# Patient Record
Sex: Female | Born: 1945
Health system: Southern US, Community
[De-identification: ages and names within clinical notes are randomized; demographics above are authoritative.]

## PROBLEM LIST (undated history)

## (undated) DIAGNOSIS — E782 Mixed hyperlipidemia: Secondary | ICD-10-CM

## (undated) DIAGNOSIS — K219 Gastro-esophageal reflux disease without esophagitis: Secondary | ICD-10-CM

## (undated) DIAGNOSIS — R251 Tremor, unspecified: Secondary | ICD-10-CM

## (undated) DIAGNOSIS — K635 Polyp of colon: Secondary | ICD-10-CM

## (undated) DIAGNOSIS — F418 Other specified anxiety disorders: Secondary | ICD-10-CM

## (undated) DIAGNOSIS — E559 Vitamin D deficiency, unspecified: Secondary | ICD-10-CM

## (undated) DIAGNOSIS — C439 Malignant melanoma of skin, unspecified: Secondary | ICD-10-CM

## (undated) DIAGNOSIS — I872 Venous insufficiency (chronic) (peripheral): Secondary | ICD-10-CM

## (undated) HISTORY — DX: Other specified anxiety disorders: F41.8

## (undated) HISTORY — DX: Gastro-esophageal reflux disease without esophagitis: K21.9

## (undated) HISTORY — DX: Malignant melanoma of skin, unspecified: C43.9

## (undated) HISTORY — DX: Venous insufficiency (chronic) (peripheral): I87.2

## (undated) HISTORY — PX: CATARACT EXTRACTION: SUR2

## (undated) HISTORY — PX: BUNIONECTOMY: SHX129

## (undated) HISTORY — DX: Tremor, unspecified: R25.1

## (undated) HISTORY — DX: Mixed hyperlipidemia: E78.2

## (undated) HISTORY — DX: Vitamin D deficiency, unspecified: E55.9

## (undated) HISTORY — DX: Polyp of colon: K63.5

---

## 1999-08-02 ENCOUNTER — Other Ambulatory Visit: Admission: RE | Admit: 1999-08-02 | Discharge: 1999-08-02 | Payer: Self-pay | Admitting: Obstetrics & Gynecology

## 2000-12-11 ENCOUNTER — Encounter: Payer: Self-pay | Admitting: Obstetrics & Gynecology

## 2000-12-11 ENCOUNTER — Encounter: Admission: RE | Admit: 2000-12-11 | Discharge: 2000-12-11 | Payer: Self-pay | Admitting: Obstetrics & Gynecology

## 2000-12-11 ENCOUNTER — Other Ambulatory Visit: Admission: RE | Admit: 2000-12-11 | Discharge: 2000-12-11 | Payer: Self-pay | Admitting: Obstetrics & Gynecology

## 2001-06-18 ENCOUNTER — Encounter: Payer: Self-pay | Admitting: Family Medicine

## 2001-06-18 ENCOUNTER — Encounter: Admission: RE | Admit: 2001-06-18 | Discharge: 2001-06-18 | Payer: Self-pay | Admitting: Family Medicine

## 2001-11-05 ENCOUNTER — Ambulatory Visit (HOSPITAL_COMMUNITY): Admission: RE | Admit: 2001-11-05 | Discharge: 2001-11-05 | Payer: Self-pay | Admitting: Gastroenterology

## 2001-11-05 ENCOUNTER — Encounter (INDEPENDENT_AMBULATORY_CARE_PROVIDER_SITE_OTHER): Payer: Self-pay | Admitting: Specialist

## 2002-05-28 ENCOUNTER — Encounter: Admission: RE | Admit: 2002-05-28 | Discharge: 2002-05-28 | Payer: Self-pay | Admitting: Obstetrics & Gynecology

## 2002-05-28 ENCOUNTER — Encounter: Payer: Self-pay | Admitting: Obstetrics & Gynecology

## 2003-06-10 ENCOUNTER — Encounter: Admission: RE | Admit: 2003-06-10 | Discharge: 2003-06-10 | Payer: Self-pay | Admitting: Obstetrics & Gynecology

## 2003-06-10 ENCOUNTER — Encounter: Payer: Self-pay | Admitting: Obstetrics & Gynecology

## 2004-03-12 ENCOUNTER — Emergency Department (HOSPITAL_COMMUNITY): Admission: EM | Admit: 2004-03-12 | Discharge: 2004-03-12 | Payer: Self-pay

## 2004-05-27 ENCOUNTER — Other Ambulatory Visit: Admission: RE | Admit: 2004-05-27 | Discharge: 2004-05-27 | Payer: Self-pay | Admitting: Obstetrics & Gynecology

## 2004-08-06 ENCOUNTER — Ambulatory Visit (HOSPITAL_COMMUNITY): Admission: RE | Admit: 2004-08-06 | Discharge: 2004-08-06 | Payer: Self-pay | Admitting: Obstetrics & Gynecology

## 2005-12-07 ENCOUNTER — Ambulatory Visit (HOSPITAL_COMMUNITY): Admission: RE | Admit: 2005-12-07 | Discharge: 2005-12-07 | Payer: Self-pay | Admitting: Obstetrics & Gynecology

## 2006-08-06 ENCOUNTER — Observation Stay (HOSPITAL_COMMUNITY): Admission: EM | Admit: 2006-08-06 | Discharge: 2006-08-06 | Payer: Self-pay | Admitting: Emergency Medicine

## 2007-01-04 ENCOUNTER — Ambulatory Visit (HOSPITAL_COMMUNITY): Admission: RE | Admit: 2007-01-04 | Discharge: 2007-01-04 | Payer: Self-pay | Admitting: Obstetrics & Gynecology

## 2008-01-31 ENCOUNTER — Ambulatory Visit (HOSPITAL_COMMUNITY): Admission: RE | Admit: 2008-01-31 | Discharge: 2008-01-31 | Payer: Self-pay | Admitting: Obstetrics & Gynecology

## 2010-12-26 DIAGNOSIS — C439 Malignant melanoma of skin, unspecified: Secondary | ICD-10-CM

## 2010-12-26 HISTORY — PX: MELANOMA EXCISION: SHX5266

## 2010-12-26 HISTORY — DX: Malignant melanoma of skin, unspecified: C43.9

## 2011-05-13 NOTE — H&P (Signed)
NAMEMILAROSE, SAVICH                 ACCOUNT NO.:  [id number]   MEDICAL RECORD NO.:  [id number]          PATIENT TYPE:  EMS   LOCATION:  MAJO                         FACILITY:  [facility]   PHYSICIAN:  Deirdre Peer. Polite, M.D. DATE OF BIRTH:  05/02/1946   DATE OF ADMISSION:  08/05/2006  DATE OF DISCHARGE:                                HISTORY & PHYSICAL   CHIEF COMPLAINT:  Per records, near syncope.   HISTORY OF PRESENT ILLNESS:  A 65 year old female, with no history of  dyslipidemia, recently given a diagnosis of GERD, depression, however,  noncompliant with meds greater than a year, presents to the ED via EMS after  being evaluated by [facility] physician at the walk-in clinic.  According to the  patient, has not been feeling well for at least two to three weeks,  chronically feeling as if she was going to pass out; however, she denies any  palpitations or arrhythmias.  Does admit to some dull sensation of pressure  in her chest.  She also denies any nausea, denies any vomiting, denies any  vertiginous symptoms.  The patient was seen at the walk-in clinic today.  While there, the patient was being evaluated initially, found to be  hypertensive at 170/120.  Upon getting up the patient had a drop in her BP  down to 50 systolic with complaints of dyspnea and was noted to be  tachycardic as well as diaphoretic.  The patient was put in Trendelenburg,  given IV fluids and felt better.  Patient transferred to the ED for further  evaluation.  EKG is notable for sinus tachycardia in the office and in [facility]  ED the patient's EKG is without any acute abnormalities.  The patient had  point of care enzymes which were within normal limits.  Had a D-dimer that  was elevated at 1.86.  BMET within normal limits.  CBC with a mild  leukocytosis of 11.3.  Otherwise within normal limits.  The patient had a CT  of the chest per ER attending reported as negative.  CT currently appears to  be locked, therefore  definitive report cannot be reviewed at this time.  Because of the above symptoms, [facility] was called for evaluation.  At  the time of my eval the patient was alert and oriented x3 without any  dizziness, weakness, etc.  The patient still is hypertensive, BP of 171/79.  The patient did have orthostatics which were positive, however, BP was still  on the high end.  Initial BP lying was 189/89, standing 155/87, sitting  138/115 with the pulse ranging from 90 to 101.  The patient describes events  as stated above.  Initially, the patient thought that she would be sent  home, however, upon pressing upon her the seriousness of her event today the  patient reluctantly stayed.  Again, the patient denies any fever, chills,  nausea, vomiting, diarrhea, constipation.  Does admit to some vague pressure  sensation in her sternum area.  Denies any orthopnea or PND.  Just kind of  general sensation throughout the week as if she periodically  felt like she  was going to pass out.  The patient had vertigo symptoms in the past and  states that these symptoms that she experienced in the last couple of weeks  are not similar.   PAST MEDICAL HISTORY:  As stated above significant for depression, high  cholesterol and GERD.   MEDICATIONS ON ADMISSION:  Aspirin 81 mg, Lipitor 20 mg, Protonix 40 mg.   SOCIAL HISTORY:  Negative for tobacco, alcohol or drugs.   PAST SURGICAL HISTORY:  None.   ALLERGIES:  She reports an allergy to penicillin which caused a rash.   FAMILY HISTORY:  Mother with multiple myeloma.  Father with myelodysplastic  syndrome.  A brother with MS.  A sister who is okay.   REVIEW OF SYSTEMS:  As stated in the HPI.   PHYSICAL EXAMINATION:  GENERAL:  The patient is alert and oriented x3 in no  apparent distress.  VITAL SIGNS:  Temp 97.6, BP 171/79, pulse 92, respiratory rate 16.  HEENT:  Pupils are equal, round and reactive to light.  Anicteric sclerae.  No oral lesions.  No nodes.   No JVD.  No carotid bruit.  CHEST:  Clear to auscultation bilaterally without rales or rhonchi.  CARDIOVASCULAR:  Regular.  No S3.  ABDOMEN:  Soft and nontender.  No hepatosplenomegaly.  EXTREMITIES:  No clubbing, cyanosis or edema.  NEURO:  Nonfocal.   DATA:  As stated in the HPI.   ASSESSMENT:  1. Near syncope.  2. Transient hypotension.  3. Depression, off meds x1 year.   I recommend the patient be admitted to a telemetry floor bed for evaluation  and treatment of the above medical problems and near syncope and transient  hypotension.  Will have orthostatics checked.  We will place the patient on  a monitor, order serial cardiac enzymes and follow up EKG in the morning.  We will entertain the possibility of ordering a 2D echo in the a.m.  We will  make further recommendations after review of the above studies.      Deirdre Peer. Polite, M.D.  Electronically Signed     RDP/MEDQ  D:  08/06/2006  T:  08/06/2006  Job:  [id number]   cc:   Caryn Bee L. Little, M.D.

## 2011-05-13 NOTE — Discharge Summary (Signed)
Desiree Allen, Desiree Allen                 ACCOUNT NO.:  [id number]   MEDICAL RECORD NO.:  [id number]          PATIENT TYPE:  INP   LOCATION:  3730                         FACILITY:  [facility]   PHYSICIAN:  Michelene Gardener, MD    DATE OF BIRTH:  07/14/1946   DATE OF ADMISSION:  08/05/2006  DATE OF DISCHARGE:  08/06/2006                                 DISCHARGE SUMMARY   PRIMARY PHYSICIAN:  Dr. Catha Gosselin.   DISCHARGE DIAGNOSES:  1. Near syncope.  2. Orthostatic hypotension.  3. Gastroesophageal reflux disease.  4. Depression.  5. Anxiety.  6. Hypercholesterolemia.   DISCHARGE MEDICATIONS:  1. Protonix 40 mg p.o. once daily and this can be increased up to twice      daily if symptoms persists.  2. Xanax 0.25 mg p.o. q.8 hours p.r.n.  3. Prozac 20 mg once daily.  4. Aspirin 81 mg p.o. once daily.  5. Lipitor 20 mg p.o. at bedtime.   CONSULTATIONS:  None.   PROCEDURES:  None.   RADIOLOGY STUDIES:  A CT scan of the head came to be negative for PE.   REASON FOR HOSPITALIZATION:  This patient is a 65 year old female, presented  to the ER after sent from [facility] for evaluation of  hypotension.  This patient went out to the walk-in clinic to be evaluated  for epigastric discomfort and when she stood down there, her blood pressure  went down and she about passed out, so she was sent to the hospital for  further evaluation.   COURSE OF HOSPITALIZATION/MEDICAL PROBLEMS:  1. Near syncope.  As mentioned, this patient had one episode of near      syncope while she was in the walk-in clinic.  That was attributed to      orthostatic hypotension.  In the ER her blood pressure has been stable      and it also continued to be stable during the time of hospitalization.      Patient was admitted to telemetry.  Her heart rate has been fine.      There are no resumed abnormalities and there is no arrhythmia.  The      patient was offered to have echocardiogram, but she insisted of  leaving      and has echocardiogram done as an outpatient.  2. Orthostatic hypotension.  This patient states that she has 2 or 4      episodes in the last 10 years.  As mentioned above, patient might need      further evaluation with echocardiogram that was given as an outpatient.  3. Anxiety.  Patient feels very anxious and she actually had some kind of      panic attacks.  Will try Xanax 0.25 mg q.8 hours p.r.n. and this to be      reevaluated by her primary physician.  4. Depression.  Patient used to be Prozac for many years and at some      point, she run out of her Prozac and then she stopped taking it by      herself.  She stated that she is feeling that she is going back to      depression and so we will start her on Prozac at 20 mg p.o. once daily      and for her primary physicians to reevaluate her and adjust her      medications as needed.  5. Hypercholesterolemia.  Patient is to continue same medication.   Assessment time is around 40 minutes.      Michelene Gardener, MD  Electronically Signed     NAE/MEDQ  D:  08/06/2006  T:  08/06/2006  Job:  [id number]   cc:   Caryn Bee L. Little, M.D.

## 2011-05-13 NOTE — H&P (Signed)
[place]  Patient:    Desiree Allen, Desiree Allen Visit Number: [id number] MRN: [id number]          Service Type: END Location: ENDO Attending Physician:  Nelda Marseille Dictated by:   Petra Kuba, M.D. Admit Date:  11/05/2001   CC:         Caryn Bee L. Little, M.D.                         History and Physical  PROCEDURE:  Colonoscopy with polypectomy.  INDICATION:  Patient with a family history of colon polyps, due for repeat screening.  Consent was signed after risks, benefits, methods, and options thoroughly discussed in the office.  MEDICATIONS:  Demerol 40 mg, Versed 5 mg.  DESCRIPTION OF PROCEDURE:  Rectal inspection was pertinent for external hemorrhoids, small.  Digital exam was negative.  The video pediatric adjustable colonoscope was inserted, easily advanced around the colon to the cecum.  This did require rolling her on her back and various pressures.  On insertion at the sigmoid-descending junction, a small polyp was seen.  We felt we could find it on withdrawal, so we elected to leave it at this stage.  No other abnormalities were seen as we advanced to the cecum, which was identified by the appendiceal orifice and the ileocecal valve.  The cecal pole did have some stool adherent to the wall which could not be washed and suctioned off.  The rest of the prep was adequate.  On slow withdrawal through the colon, the ascending and transverse and descending were all normal.  The polyp on insertion was seen and snared, electrocautery applied, and the polyp was suctioned through the scope and collected in the trap.  The scope was slowly withdrawn back to the rectum.  No additional findings were seen.  The scope was retroflexed, revealing some internal hemorrhoids and small anal papilla.  The scope was straightened and readvanced a short way up the left side of the colon, air was suctioned, the scope removed.  The patient tolerated the  procedure well with no obvious immediate complication.  ENDOSCOPIC DIAGNOSES: 1. Internal-external hemorrhoids. 2. Sigmoid-descending junction small polyp, snared. 3. Otherwise within normal limits to the cecum.  PLAN:  Await pathology but probably recheck colon screening in five years.  GI follow-up p.r.n.  Otherwise return care to Dr. Clarene Duke for customary health care maintenance to include yearly rectals and guaiacs. Dictated by:   Petra Kuba, M.D. Attending Physician:  Nelda Marseille DD:  11/05/01 TD:  11/05/01 Job: [id number]

## 2011-07-12 ENCOUNTER — Other Ambulatory Visit: Payer: Self-pay | Admitting: Dermatology

## 2011-08-11 ENCOUNTER — Other Ambulatory Visit: Payer: Self-pay | Admitting: Dermatology

## 2011-11-07 ENCOUNTER — Other Ambulatory Visit: Payer: Self-pay | Admitting: Dermatology

## 2012-02-09 ENCOUNTER — Other Ambulatory Visit: Payer: Self-pay | Admitting: Dermatology

## 2012-09-24 ENCOUNTER — Other Ambulatory Visit: Payer: Self-pay | Admitting: Obstetrics & Gynecology

## 2012-10-05 ENCOUNTER — Other Ambulatory Visit: Payer: Self-pay | Admitting: Dermatology

## 2013-04-10 ENCOUNTER — Other Ambulatory Visit: Payer: Self-pay | Admitting: Dermatology

## 2013-11-14 ENCOUNTER — Other Ambulatory Visit: Payer: Self-pay | Admitting: Dermatology

## 2014-01-16 ENCOUNTER — Other Ambulatory Visit: Payer: Self-pay | Admitting: Dermatology

## 2015-03-16 ENCOUNTER — Other Ambulatory Visit: Payer: Self-pay | Admitting: Obstetrics & Gynecology

## 2015-03-17 LAB — CYTOLOGY - PAP

## 2016-11-21 ENCOUNTER — Ambulatory Visit (INDEPENDENT_AMBULATORY_CARE_PROVIDER_SITE_OTHER): Payer: Medicare Other | Admitting: Neurology

## 2016-11-21 ENCOUNTER — Encounter: Payer: Self-pay | Admitting: Neurology

## 2016-11-21 DIAGNOSIS — G250 Essential tremor: Secondary | ICD-10-CM | POA: Insufficient documentation

## 2016-11-21 NOTE — Progress Notes (Signed)
PATIENT: Desiree Allen DOB: 1946-04-09  Chief Complaint  Patient presents with  . Tremors    Report tremors in her bilateral upper extremities.  Symptoms have been progressing over the last two years.  Marland Kitchen PCP    Hulan Fess, MD     HISTORICAL  Desiree Allen is a 70 years old left-handed female, seen in refer by her primary care doctor  Hulan Fess for evaluation of bilateral hands tremor, initial evaluation was on November 21 2016  I reviewed and summarized referring note, she had a history of hyperlipidemia, depression anxiety, vitamin D deficiency, history of right arm melanoma resection in 2012, there was no family history of tremor.    She noticed hand tremor since 2015, when she stretch out her left arm holding a book, she noticed left hand tremor, also intermittently right hand tremor, she denies gait abnormality, she had a gradual onset loss sense of smell, occasionally vivid dreams, but no REM sleep disorder, there was no family history of tremor,  she denies orthostatic symptoms,   The tremor is more of a nuisance for her, there was no significant limitation, she is a retired Education officer, museum.   REVIEW OF SYSTEMS: Full 14 system review of systems performed and notable only for snoring, tremor, ringing ears, incontinence  ALLERGIES: Allergies  Allergen Reactions  . Penicillins Rash    HOME MEDICATIONS: Current Outpatient Prescriptions  Medication Sig Dispense Refill  . atorvastatin (LIPITOR) 20 MG tablet     . PARoxetine (PAXIL) 40 MG tablet daily.     No current facility-administered medications for this visit.     PAST MEDICAL HISTORY: Past Medical History:  Diagnosis Date  . Depression with anxiety   . GERD (gastroesophageal reflux disease)   . Hyperplastic colon polyp   . Melanoma ([place]) 2012  . Mixed dyslipidemia   . Tremor   . Venous insufficiency of both lower extremities   . Vitamin D deficiency     PAST SURGICAL HISTORY: Past Surgical History:    Procedure Laterality Date  . BUNIONECTOMY Left   . CATARACT EXTRACTION Bilateral   . MELANOMA EXCISION  2012    FAMILY HISTORY: Family History  Problem Relation Age of Onset  . Multiple myeloma Mother   . Anemia Father     SOCIAL HISTORY:  Social History   Social History  . Marital status: Married    Spouse name: N/A  . Number of children: 3  . Years of education: Bachelors   Occupational History  . Retired    Social History Main Topics  . Smoking status: Never Smoker  . Smokeless tobacco: Never Used  . Alcohol use No  . Drug use: No  . Sexual activity: Not on file   Other Topics Concern  . Not on file   Social History Narrative   Lives at home with husband.   Left-handed.   No caffeine use.     PHYSICAL EXAM   Vitals:   11/21/16 1356  BP: 132/81  Pulse: 88  Weight: 173 lb (78.5 kg)  Height: 5' 7.5" (1.715 m)    Not recorded      Body mass index is 26.7 kg/m.  PHYSICAL EXAMNIATION:  Gen: NAD, conversant, well nourised, obese, well groomed                     Cardiovascular: Regular rate rhythm, no peripheral edema, warm, nontender. Eyes: Conjunctivae clear without exudates or hemorrhage Neck: Supple, no  carotid bruits. Pulmonary: Clear to auscultation bilaterally   NEUROLOGICAL EXAM:  MENTAL STATUS: Speech:    Speech is normal; fluent and spontaneous with normal comprehension.  Cognition:     Orientation to time, place and person     Normal recent and remote memory     Normal Attention span and concentration     Normal Language, naming, repeating,spontaneous speech     Fund of knowledge   CRANIAL NERVES: CN II: Visual fields are full to confrontation. Fundoscopic exam is normal with sharp discs and no vascular changes. Pupils are round equal and briskly reactive to light. CN III, IV, VI: extraocular movement are normal. No ptosis. CN V: Facial sensation is intact to pinprick in all 3 divisions bilaterally. Corneal responses are  intact.  CN VII: Face is symmetric with normal eye closure and smile. CN VIII: Hearing is normal to rubbing fingers CN IX, X: Palate elevates symmetrically. Phonation is normal. CN XI: Head turning and shoulder shrug are intact CN XII: Tongue is midline with normal movements and no atrophy.  MOTOR: There is no pronator drift of out-stretched arms. Muscle bulk and tone are normal. Muscle strength is normal.  REFLEXES: Reflexes are 2+ and symmetric at the biceps, triceps, knees, and ankles. Plantar responses are flexor.  SENSORY: Intact to light touch, pinprick, positional sensation and vibratory sensation are intact in fingers and toes.  COORDINATION: Rapid alternating movements and fine finger movements are intact. There is no dysmetria on finger-to-nose and heel-knee-shin.    GAIT/STANCE: Posture is normal. Gait is steady with normal steps, base, arm swing, and turning. Heel and toe walking are normal. Tandem gait is normal.  Romberg is absent.   DIAGNOSTIC DATA (LABS, IMAGING, TESTING) - I reviewed patient records, labs, notes, testing and imaging myself where available.   ASSESSMENT AND PLAN  Desiree Allen is a 70 y.o. female   Essential tremor  Reported normal thyroid function test recently  After discussed with patient, she denies significant functional limitation, we will not started any medication, but if her symptoms are getting worse, choices are beta blocker such as propanolol, primidone, even benzodiazepines.   Marcial Pacas, M.D. Ph.D.  [facility] [address] [place], [place] [address] Ph: [phone] Fax: [phone]  CC: Hulan Fess, MD

## 2017-09-17 ENCOUNTER — Ambulatory Visit (HOSPITAL_COMMUNITY)
Admission: RE | Admit: 2017-09-17 | Discharge: 2017-09-17 | Disposition: A | Discharge status: Home / Self Care | Payer: Medicare Other | Source: Ambulatory Visit | Attending: Physician Assistant | Admitting: Physician Assistant

## 2017-09-17 ENCOUNTER — Other Ambulatory Visit: Payer: Self-pay | Admitting: Physician Assistant

## 2017-09-17 DIAGNOSIS — M79605 Pain in left leg: Secondary | ICD-10-CM

## 2017-09-17 NOTE — Progress Notes (Addendum)
VASCULAR LAB PRELIMINARY  PRELIMINARY  PRELIMINARY  PRELIMINARY  Left lower extremity venous duplex completed.    Preliminary report:  There is no DVT, SVT, or Baker's cyst noted in the left lower extremity.   Called report to [employer].  Tashe Purdon, RVT 09/17/2017, 3:56 PM

## 2017-11-27 ENCOUNTER — Other Ambulatory Visit: Payer: Self-pay

## 2017-11-27 DIAGNOSIS — I739 Peripheral vascular disease, unspecified: Secondary | ICD-10-CM

## 2018-01-24 ENCOUNTER — Ambulatory Visit (HOSPITAL_COMMUNITY)
Admission: RE | Admit: 2018-01-24 | Discharge: 2018-01-24 | Disposition: A | Discharge status: Home / Self Care | Payer: Medicare Other | Source: Ambulatory Visit | Attending: Vascular Surgery | Admitting: Vascular Surgery

## 2018-01-24 ENCOUNTER — Ambulatory Visit: Payer: Medicare Other | Admitting: Vascular Surgery

## 2018-01-24 ENCOUNTER — Encounter: Payer: Self-pay | Admitting: Vascular Surgery

## 2018-01-24 VITALS — BP 130/74 | HR 71 | Temp 97.2°F | Resp 18 | Ht 67.5 in | Wt 170.0 lb

## 2018-01-24 DIAGNOSIS — I739 Peripheral vascular disease, unspecified: Secondary | ICD-10-CM | POA: Insufficient documentation

## 2018-01-24 NOTE — Progress Notes (Signed)
Patient name: Desiree Allen MRN: [id number] DOB: [birth year] Sex: female  REASON FOR CONSULT:    Peripheral vascular disease.  The consult is requested by Dr. Hulan Fess.  HPI:   Desiree Allen is a pleasant 72 y.o. female, who reportedly had an abnormal screening test which suggested she might have some underlying peripheral vascular disease.  She was sent for vascular consultation.  She denies any history of claudication, rest pain, or nonhealing ulcers.  She is unaware of any previous history of DVT or phlebitis.  She does not have any significant pain or aching in her legs with standing.  She does have some compression stockings but has a hard time getting these on so does not wear them very often.  Her only real risk factor for peripheral vascular disease is hypercholesterolemia.  She denies any history of diabetes, hypertension, family history of premature cardiovascular disease or tobacco use.  Past Medical History:  Diagnosis Date  . Depression with anxiety   . GERD (gastroesophageal reflux disease)   . Hyperplastic colon polyp   . Melanoma ([place]) 2012  . Mixed dyslipidemia   . Tremor   . Venous insufficiency of both lower extremities   . Vitamin D deficiency     Family History  Problem Relation Age of Onset  . Multiple myeloma Mother   . Anemia Father   There is no family history of premature cardiovascular disease.  SOCIAL HISTORY: She is not a smoker. Social History   Socioeconomic History  . Marital status: Married    Spouse name: Not on file  . Number of children: 3  . Years of education: Bachelors  . Highest education level: Not on file  Social Needs  . Financial resource strain: Not on file  . Food insecurity - worry: Not on file  . Food insecurity - inability: Not on file  . Transportation needs - medical: Not on file  . Transportation needs - non-medical: Not on file  Occupational History  . Occupation: Retired  Tobacco Use  . Smoking status: Never  Smoker  . Smokeless tobacco: Never Used  Substance and Sexual Activity  . Alcohol use: No  . Drug use: No  . Sexual activity: Not on file  Other Topics Concern  . Not on file  Social History Narrative   Lives at home with husband.   Left-handed.   No caffeine use.    Allergies  Allergen Reactions  . Penicillins Rash    Current Outpatient Medications  Medication Sig Dispense Refill  . atorvastatin (LIPITOR) 20 MG tablet Take 20 mg by mouth daily.     . Cholecalciferol (VITAMIN D) 2000 units CAPS Take by mouth daily.    . Multiple Vitamins-Minerals (WOMENS MULTIVITAMIN PO) Take by mouth daily.    . Omega-3 Fatty Acids (FISH OIL) 1200 MG CAPS Take 1,200 mg by mouth daily.    Marland Kitchen oxybutynin (DITROPAN) 5 MG tablet Take 5 mg by mouth 1 day or 1 dose.    Marland Kitchen PARoxetine (PAXIL) 40 MG tablet Take 40 mg by mouth daily.     . ranitidine (ZANTAC) 150 MG capsule Take 150 mg by mouth 2 (two) times daily.    Marland Kitchen aspirin 81 MG tablet Take 81 mg by mouth daily.     No current facility-administered medications for this visit.     REVIEW OF SYSTEMS:  '[X]'  denotes positive finding, '[ ]'  denotes negative finding Cardiac  Comments:  Chest pain or chest pressure:  Shortness of breath upon exertion:    Short of breath when lying flat:    Irregular heart rhythm:        Vascular    Pain in calf, thigh, or hip brought on by ambulation:    Pain in feet at night that wakes you up from your sleep:     Blood clot in your veins:    Leg swelling:  x       Pulmonary    Oxygen at home:    Productive cough:     Wheezing:         Neurologic    Sudden weakness in arms or legs:     Sudden numbness in arms or legs:     Sudden onset of difficulty speaking or slurred speech:    Temporary loss of vision in one eye:     Problems with dizziness:         Gastrointestinal    Blood in stool:     Vomited blood:         Genitourinary    Burning when urinating:     Blood in urine:        Psychiatric      Major depression:  x       Hematologic    Bleeding problems:    Problems with blood clotting too easily:        Skin    Rashes or ulcers:        Constitutional    Fever or chills:     PHYSICAL EXAM:   Vitals:   01/24/18 1249  BP: 130/74  Pulse: 71  Resp: 18  Temp: (!) 97.2 F (36.2 C)  TempSrc: Oral  SpO2: 98%  Weight: 170 lb (77.1 kg)  Height: 5' 7.5" (1.715 m)    GENERAL: The patient is a well-nourished female, in no acute distress. The vital signs are documented above. CARDIAC: There is a regular rate and rhythm.  VASCULAR: I do not detect carotid bruits. She has palpable femoral pulses bilaterally.  She has a palpable right posterior tibial pulse and left dorsalis pedis pulse. She has some spider veins bilaterally and mild bilateral lower extremity swelling. PULMONARY: There is good air exchange bilaterally without wheezing or rales. ABDOMEN: Soft and non-tender with normal pitched bowel sounds.  MUSCULOSKELETAL: There are no major deformities or cyanosis. NEUROLOGIC: No focal weakness or paresthesias are detected. SKIN: There are no ulcers or rashes noted. PSYCHIATRIC: The patient has a normal affect.  DATA:    LOWER EXTREMITY ARTERIAL DOPPLER STUDY: I have independently interpreted her lower extremity arterial Doppler study today.  On the right side, there are triphasic waveforms in the dorsalis pedis and posterior tibial positions.  ABI on the right is 100%.  On the left side, there are triphasic waveforms in the dorsalis pedis and posterior tibial positions.  ABI on the left is 100%.  MEDICAL ISSUES:   CHRONIC VENOUS INSUFFICIENCY: Based on her exam she does have some evidence of chronic venous insufficiency.  She has some spider veins bilaterally and mild bilateral lower extremity swelling.  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  We have discussed the importance of intermittent leg elevation and the proper positioning for this.  I have written her  prescription for knee-high compression stockings with a gradient of 15-20 mmHg.  I have encouraged her to avoid prolonged sitting and standing and to exercise as much as possible.  We also discussed water aerobics which  I think is very helpful for patients with chronic venous insufficiency.  I reassured her that she has no evidence of arterial insufficiency.  Has palpable pedal pulses and normal Doppler signals with normal ABIs.  I will be happy to see her back in the future if any new vascular issues arise.  Deitra Mayo [facility] [phone]

## 2019-12-02 ENCOUNTER — Ambulatory Visit (INDEPENDENT_AMBULATORY_CARE_PROVIDER_SITE_OTHER): Payer: Medicare Other

## 2019-12-02 ENCOUNTER — Other Ambulatory Visit: Payer: Self-pay

## 2019-12-02 ENCOUNTER — Ambulatory Visit: Payer: Medicare Other | Admitting: Pulmonary Disease

## 2019-12-02 ENCOUNTER — Encounter: Payer: Self-pay | Admitting: Pulmonary Disease

## 2019-12-02 DIAGNOSIS — J90 Pleural effusion, not elsewhere classified: Secondary | ICD-10-CM

## 2019-12-02 NOTE — Patient Instructions (Signed)
Chest xray is clear

## 2019-12-02 NOTE — Progress Notes (Signed)
Subjective:    Patient ID: Desiree Allen, female    DOB: 1946/10/28, 73 y.o.   MRN: [id number]  HPI  73 year old never smoker presents for evaluation of small left pleural effusion noted on chest x-ray done in urgent care. She reports an accidental fall on the day before Thanksgiving.  She head her left chest against the mirror of her son's truck that was parked and fell down, hit her flowerpot on the way down before landing on the concrete on the right side of her face.  Her right eye was bruised.  She went to urgent care where chest x-ray showed small left pleural effusion with no evidence of rib fractures. No other injuries were reported.  She has been taking pain medications for the last few days and feels 75% improved, the bruise around the right eye has almost resolved.  She denies dyspnea, or wheezing or pleuritic chest pain. She reports occasional ankle swelling due to varicose veins in her right leg   CT angiogram 07/2006 was negative except for aberrant right subclavian artery, she does not remember why this was done   Past Medical History:  Diagnosis Date  . Depression with anxiety   . GERD (gastroesophageal reflux disease)   . Hyperplastic colon polyp   . Melanoma ([place]) 2012  . Mixed dyslipidemia   . Tremor   . Venous insufficiency of both lower extremities   . Vitamin D deficiency    Past Surgical History:  Procedure Laterality Date  . BUNIONECTOMY Left   . CATARACT EXTRACTION Bilateral   . MELANOMA EXCISION  2012     Allergies  Allergen Reactions  . Penicillins Rash    Social History   Socioeconomic History  . Marital status: Married    Spouse name: Not on file  . Number of children: 3  . Years of education: Bachelors  . Highest education level: Not on file  Occupational History  . Occupation: Retired  Scientific laboratory technician  . Financial resource strain: Not on file  . Food insecurity    Worry: Not on file    Inability: Not on file  . Transportation needs     Medical: Not on file    Non-medical: Not on file  Tobacco Use  . Smoking status: Never Smoker  . Smokeless tobacco: Never Used  Substance and Sexual Activity  . Alcohol use: No  . Drug use: No  . Sexual activity: Not on file  Lifestyle  . Physical activity    Days per week: Not on file    Minutes per session: Not on file  . Stress: Not on file  Relationships  . Social Herbalist on phone: Not on file    Gets together: Not on file    Attends religious service: Not on file    Active member of club or organization: Not on file    Attends meetings of clubs or organizations: Not on file    Relationship status: Not on file  . Intimate partner violence    Fear of current or ex partner: Not on file    Emotionally abused: Not on file    Physically abused: Not on file    Forced sexual activity: Not on file  Other Topics Concern  . Not on file  Social History Narrative   Lives at home with husband.   Left-handed.   No caffeine use.     Family History  Problem Relation Age of Onset  . Multiple myeloma  Mother   . Anemia Father      Review of Systems  Constitutional: negative for anorexia, fevers and sweats  Eyes: negative for irritation, redness and visual disturbance  Ears, nose, mouth, throat, and face: negative for earaches, epistaxis, nasal congestion and sore throat  Respiratory: negative for cough, dyspnea on exertion, sputum and wheezing  Cardiovascular: negative for chest pain, dyspnea, lower extremity edema, orthopnea, palpitations and syncope  Gastrointestinal: negative for abdominal pain, constipation, diarrhea, melena, nausea and vomiting  Genitourinary:negative for dysuria, frequency and hematuria  Hematologic/lymphatic: negative for bleeding, easy bruising and lymphadenopathy  Musculoskeletal:negative for arthralgias, muscle weakness and stiff joints  Neurological: negative for coordination problems, gait problems, headaches and weakness   Endocrine: negative for diabetic symptoms including polydipsia, polyuria and weight loss     Objective:   Physical Exam  Gen. Pleasant, well-nourished, in no distress, normal affect ENT - no pallor,icterus, no post nasal drip Neck: No JVD, no thyromegaly, no carotid bruits Lungs: no use of accessory muscles, no dullness to percussion, clear without rales or rhonchi  Cardiovascular: Rhythm regular, heart sounds  normal, no murmurs or gallops, no peripheral edema Abdomen: soft and non-tender, no hepatosplenomegaly, BS normal. Musculoskeletal: No deformities, no cyanosis or clubbing Neuro:  alert, non focal , tremors +, no cog wheel       Assessment & Plan:

## 2019-12-02 NOTE — Assessment & Plan Note (Addendum)
Pleural effusion may have been related to pulmonary contusion although she does not seem to have any chest bruising externally Repeat chest x-ray today shows clear left costophrenic angle No follow-up required

## 2020-03-24 ENCOUNTER — Encounter: Payer: Self-pay | Admitting: Neurology

## 2020-03-24 ENCOUNTER — Other Ambulatory Visit: Payer: Self-pay

## 2020-03-24 ENCOUNTER — Ambulatory Visit: Payer: Medicare PPO | Admitting: Neurology

## 2020-03-24 VITALS — BP 132/82 | HR 76 | Temp 96.9°F | Ht 67.0 in | Wt 163.0 lb

## 2020-03-24 DIAGNOSIS — G903 Multi-system degeneration of the autonomic nervous system: Secondary | ICD-10-CM | POA: Insufficient documentation

## 2020-03-24 DIAGNOSIS — R413 Other amnesia: Secondary | ICD-10-CM | POA: Diagnosis not present

## 2020-03-24 DIAGNOSIS — R269 Unspecified abnormalities of gait and mobility: Secondary | ICD-10-CM | POA: Diagnosis not present

## 2020-03-24 DIAGNOSIS — G20 Parkinson's disease: Secondary | ICD-10-CM | POA: Insufficient documentation

## 2020-03-24 MED ORDER — CARBIDOPA-LEVODOPA 25-100 MG PO TABS: 1.0000 | ORAL_TABLET | Freq: Three times a day (TID) | ORAL | 6 refills | Status: DC

## 2020-03-24 NOTE — Progress Notes (Addendum)
PATIENT: TAMMARA MASSING DOB: 1946-09-03  Chief Complaint  Patient presents with  . Tremors    She is here for evaluation of body tremors. Symptoms are worse in her bilateral hands.  Marland Kitchen PCP    Little, Lennette Bihari, MD     HISTORICAL  Runette Scifres Dhaliwal is a 74 year old female, seen in request by her primary care physician Little, Lennette Bihari for evaluation of bilateral hands tremor, initial evaluation was on March 25, 2020.  I have reviewed and summarized the referring note from the referring physician.  She has past medical history of anxiety, taking Paxil 40 mg daily, is a retired Airline pilot, she also had a history of right arm melanoma resection in 2012.  I saw her initially in November 2017 for bilateral hands tremor, she did complaints lost sense of smell at that time, also vivid dreams, but no excessive movement during her sleep, there was no family history of tremor.  At that visit, she was noted to have fairly symmetric bilateral hands posturing tremor, was diagnosed with possible essential tremor, she denies significant functional limitation in her daily activity  Over the years, bilateral hands tremor gradually getting worse, now she also complains of mild gait abnormality, especially since her fall by accident in November 2020, she also complains of stress incontinence, frequent nocturia, which has helped by oxybutynin  She continues to notice decreased sense of smell, no REM sleep disorder, she also concerned about her gradual onset mild memory loss,  Today's examination showed clearly parkinsonian features, mild bilateral upper extremity rigidity, bradykinesia, but fairly symmetric resting tremor, decreased bilateral arm swing, ambulate  REVIEW OF SYSTEMS: Full 14 system review of systems performed and notable only for as above All other review of systems were negative.  ALLERGIES: Allergies  Allergen Reactions  . Penicillins Rash    HOME MEDICATIONS: Current  Outpatient Medications  Medication Sig Dispense Refill  . atorvastatin (LIPITOR) 20 MG tablet Take 20 mg by mouth daily.     . Cholecalciferol (VITAMIN D) 2000 units CAPS Take by mouth daily.    . meloxicam (MOBIC) 15 MG tablet Take 1 tablet by mouth daily as needed.    . Multiple Vitamins-Minerals (WOMENS MULTIVITAMIN PO) Take by mouth daily.    . Omega-3 Fatty Acids (FISH OIL) 1200 MG CAPS Take 1,200 mg by mouth daily.    Marland Kitchen oxybutynin (DITROPAN) 5 MG tablet Take 5 mg by mouth 1 day or 1 dose.    Marland Kitchen PARoxetine (PAXIL) 40 MG tablet Take 40 mg by mouth daily.      No current facility-administered medications for this visit.    PAST MEDICAL HISTORY: Past Medical History:  Diagnosis Date  . Depression with anxiety   . GERD (gastroesophageal reflux disease)   . Hyperplastic colon polyp   . Melanoma ([place]) 2012  . Mixed dyslipidemia   . Tremor   . Venous insufficiency of both lower extremities   . Vitamin D deficiency     PAST SURGICAL HISTORY: Past Surgical History:  Procedure Laterality Date  . BUNIONECTOMY Left   . CATARACT EXTRACTION Bilateral   . MELANOMA EXCISION  2012    FAMILY HISTORY: Family History  Problem Relation Age of Onset  . Multiple myeloma Mother   . Anemia Father     SOCIAL HISTORY: Social History   Socioeconomic History  . Marital status: Married    Spouse name: Not on file  . Number of children: 3  . Years of education:  Bachelors  . Highest education level: Not on file  Occupational History  . Occupation: Retired  Tobacco Use  . Smoking status: Never Smoker  . Smokeless tobacco: Never Used  Substance and Sexual Activity  . Alcohol use: No  . Drug use: No  . Sexual activity: Not on file  Other Topics Concern  . Not on file  Social History Narrative   Lives at home with husband.   Left-handed.   No caffeine use.   Social Determinants of Health   Financial Resource Strain:   . Difficulty of Paying Living Expenses:   Food Insecurity:     . Worried About Charity fundraiser in the Last Year:   . Arboriculturist in the Last Year:   Transportation Needs:   . Film/video editor (Medical):   Marland Kitchen Lack of Transportation (Non-Medical):   Physical Activity:   . Days of Exercise per Week:   . Minutes of Exercise per Session:   Stress:   . Feeling of Stress :   Social Connections:   . Frequency of Communication with Friends and Family:   . Frequency of Social Gatherings with Friends and Family:   . Attends Religious Services:   . Active Member of Clubs or Organizations:   . Attends Archivist Meetings:   Marland Kitchen Marital Status:   Intimate Partner Violence:   . Fear of Current or Ex-Partner:   . Emotionally Abused:   Marland Kitchen Physically Abused:   . Sexually Abused:      PHYSICAL EXAM   Vitals:   03/24/20 1311  BP: 132/82  Pulse: 76  Temp: (!) 96.9 F (36.1 C)  Weight: 163 lb (73.9 kg)  Height: '5\' 7"'  (1.702 m)    Not recorded      Body mass index is 25.53 kg/m.  PHYSICAL EXAMNIATION:  Gen: NAD, conversant, well nourised, well groomed                     Cardiovascular: Regular rate rhythm, no peripheral edema, warm, nontender. Eyes: Conjunctivae clear without exudates or hemorrhage Neck: Supple, no carotid bruits. Pulmonary: Clear to auscultation bilaterally   NEUROLOGICAL EXAM:  MMSE - Mini Mental State Exam 03/24/2020  Orientation to time 5  Orientation to Place 5  Registration 3  Attention/ Calculation 3  Recall 1  Language- name 2 objects 2  Language- repeat 1  Language- follow 3 step command 3  Language- read & follow direction 1  Write a sentence 1  Copy design 1  Total score 26     CRANIAL NERVES: CN II: Visual fields are full to confrontation. Pupils are round equal and briskly reactive to light. CN III, IV, VI: extraocular movement are normal. No ptosis. CN V: Facial sensation is intact to light touch CN VII: Face is symmetric with normal eye closure  CN VIII: Hearing is normal to  causal conversation. CN IX, X: Phonation is normal. CN XI: Head turning and shoulder shrug are intact  MOTOR: She has constant bilateral hands resting tremor, no weakness, right more than left upper and lower extremity rigidity, bradykinesia,  REFLEXES: Reflexes are 2+ and symmetric at the biceps, triceps, knees, and ankles. Plantar responses are flexor.  SENSORY: Intact to light touch, pinprick and vibratory sensation are intact in fingers and toes.  COORDINATION: There is no trunk or limb dysmetria noted.  GAIT/STANCE: She needs push-up to get up from seated position, leaning forward, decreased bilateral arm swing, has retropulsed  instability, Romberg is absent.   DIAGNOSTIC DATA (LABS, IMAGING, TESTING) - I reviewed patient records, labs, notes, testing and imaging myself where available.   ASSESSMENT AND PLAN  Ardath A Koob is a 74 y.o. female   Parkinsonism Mild cognitive impairment  Mini-Mental Status Examination 26/30  She had symptom onset in 2015, fairly symmetric bilateral hands tremor, now she has clearly parkinsonian features,  Differentiation diagnosis include Parkinson's disease,  MRI of brain  Data scan of brain  Trial of Sinemet 25/100 mg 3 times a day  Get laboratory evaluation report from her primary care physician  Gait abnormality urinary urgency,  She has hyperreflexia on examination,  My of cervical spine to rule out cervical spondylitic myelopathy  Marcial Pacas, M.D. Ph.D.  [facility] [address], [place], [place] [address] Ph: [phone] Fax: [phone]  CC: Hulan Fess, MD

## 2020-03-26 ENCOUNTER — Telehealth: Payer: Self-pay | Admitting: Neurology

## 2020-03-26 NOTE — Telephone Encounter (Signed)
[employer] pending faxed notes  

## 2020-03-30 NOTE — Telephone Encounter (Signed)
Mcarthur Rossetti Josem Kaufmann: [id number] (exp. 03/26/20 to 04/25/20) order sent to [facility]. They will reach out to the patient to schedule.

## 2020-05-06 ENCOUNTER — Other Ambulatory Visit: Payer: Self-pay

## 2020-05-06 ENCOUNTER — Encounter (HOSPITAL_COMMUNITY)
Admission: RE | Admit: 2020-05-06 | Discharge: 2020-05-06 | Disposition: A | Discharge status: Home / Self Care | Payer: Medicare PPO | Source: Ambulatory Visit | Attending: Neurology | Admitting: Neurology

## 2020-05-06 ENCOUNTER — Telehealth: Payer: Self-pay | Admitting: Neurology

## 2020-05-06 DIAGNOSIS — R413 Other amnesia: Secondary | ICD-10-CM | POA: Diagnosis not present

## 2020-05-06 DIAGNOSIS — G20 Parkinson's disease: Secondary | ICD-10-CM | POA: Diagnosis not present

## 2020-05-06 DIAGNOSIS — R269 Unspecified abnormalities of gait and mobility: Secondary | ICD-10-CM | POA: Insufficient documentation

## 2020-05-06 DIAGNOSIS — R251 Tremor, unspecified: Secondary | ICD-10-CM | POA: Diagnosis not present

## 2020-05-06 MED ORDER — IODINE STRONG (LUGOLS) 5 % PO SOLN: 0.8000 mL | Freq: Once | ORAL | Status: DC

## 2020-05-06 MED ORDER — IOFLUPANE I 123 185 MBQ/2.5ML IV SOLN
4.7000 | Freq: Once | INTRAVENOUS | Status: AC | PRN
  Administered 2020-05-06: 4.7 via INTRAVENOUS
  Filled 2020-05-06: qty 5

## 2020-05-06 MED ORDER — IODINE STRONG (LUGOLS) 5 % PO SOLN
ORAL | Status: AC
  Administered 2020-05-06: 0.8 mL via ORAL
  Filled 2020-05-06: qty 1

## 2020-05-06 NOTE — Telephone Encounter (Signed)
IMPRESSION: Decrease radiotracer activity within the LEFT and RIGHT stratum is a pattern suggestive of Parkinson's syndrome pathology.  Greater loss of dopamine transport populations on the RIGHT compared to the LEFT.  Please call patient, data scan showed evidence of decreased radiotracer activity within bilateral stratum (deeper brain structures), greater loss on the right side, Consistent with her parkinsonisms features

## 2020-05-07 NOTE — Telephone Encounter (Signed)
I called pt. No answer, left a message asking pt to call me back.   

## 2020-05-13 NOTE — Telephone Encounter (Signed)
I called pt. No answer, left a message asking pt to call me back.   This was the second attempt to reach the pt. I will mail letter asking pt to call so we could review results.

## 2020-05-26 NOTE — Telephone Encounter (Signed)
I was able to speak to the patient. She verbalized understanding of the results below. She will keep her pending follow up on 07/02/20 for further review with Dr. Krista Blue.

## 2020-05-26 NOTE — Telephone Encounter (Signed)
Pt called back in regards to missed call  

## 2020-07-02 ENCOUNTER — Other Ambulatory Visit: Payer: Self-pay

## 2020-07-02 ENCOUNTER — Ambulatory Visit: Payer: Medicare PPO | Admitting: Neurology

## 2020-07-02 ENCOUNTER — Telehealth: Payer: Self-pay | Admitting: Neurology

## 2020-07-02 ENCOUNTER — Encounter: Payer: Self-pay | Admitting: Neurology

## 2020-07-02 VITALS — BP 115/78 | HR 76 | Ht 67.0 in | Wt 161.0 lb

## 2020-07-02 DIAGNOSIS — R269 Unspecified abnormalities of gait and mobility: Secondary | ICD-10-CM | POA: Diagnosis not present

## 2020-07-02 DIAGNOSIS — R413 Other amnesia: Secondary | ICD-10-CM | POA: Diagnosis not present

## 2020-07-02 DIAGNOSIS — G20 Parkinson's disease: Secondary | ICD-10-CM

## 2020-07-02 MED ORDER — CARBIDOPA-LEVODOPA 25-100 MG PO TABS: 1.0000 | ORAL_TABLET | Freq: Three times a day (TID) | ORAL | 4 refills | Status: DC

## 2020-07-02 NOTE — Telephone Encounter (Signed)
Please make sure she is on schedule for MRI of brain and cervical spine

## 2020-07-02 NOTE — Telephone Encounter (Signed)
Noted, GI has tried to reach out to the patient twice. Once on 03/31/20 at 04/07/20. I sent the order again to GI. They will reach out to the patient to schedule.

## 2020-07-02 NOTE — Patient Instructions (Signed)
B12 TSH

## 2020-07-02 NOTE — Progress Notes (Signed)
PATIENT: Desiree Allen DOB: [birth year]  Chief Complaint  Patient presents with  . Parkinson's Disease    She would like to review her DaTscan results. Feels symptoms are under better control since starting Sinemet. She never had the MRI scans of brain and cervical spine. She did not remember getting the messages.      HISTORICAL  Desiree Allen is a 74 year old female, seen in request by her primary care physician Little, Lennette Bihari for evaluation of bilateral hands tremor, initial evaluation was on March 25, 2020.  I have reviewed and summarized the referring note from the referring physician.  She has past medical history of anxiety, taking Paxil 40 mg daily, is a retired Airline pilot, she also had a history of right arm melanoma resection in 2012.  I saw her initially in November 2017 for bilateral hands tremor, she did complaints lost sense of smell at that time, also vivid dreams, but no excessive movement during her sleep, there was no family history of tremor.  At that visit, she was noted to have fairly symmetric bilateral hands posturing tremor, was diagnosed with possible essential tremor, she denies significant functional limitation in her daily activity  Over the years, bilateral hands tremor gradually getting worse, now she also complains of mild gait abnormality, especially since her fall by accident in November 2020, she also complains of stress incontinence, frequent nocturia, which has helped by oxybutynin  She continues to notice decreased sense of smell, no REM sleep disorder, she also concerned about her gradual onset mild memory loss,  Today's examination showed clearly parkinsonian features, mild bilateral upper extremity rigidity, bradykinesia, but fairly symmetric resting tremor, decreased bilateral arm swing, ambulate  UPDATE July 8th 2021 She reported 60% improvement after starting Sinemet 25/100 mg 3 times a day, she can walk better, less tremor, she  did occasionally noticed end dose wearing off, discussed formal diagnosis of Parkinson's disease  Laboratory evaluation from primary care in September 2020 normal CBC, hemoglobin of 14.2, LDL 87, CMP  Personally reviewed DAT scan May 2021  Decrease radiotracer activity within the LEFT and RIGHT stratum is a pattern suggestive of Parkinson's syndrome pathology.  Greater loss of dopamine transport populations on the RIGHT compared to the LEFT.  She continue complains of mild memory loss, urinary incontinence, hyperreflexia on examination,  REVIEW OF SYSTEMS: Full 14 system review of systems performed and notable only for as above All other review of systems were negative.  ALLERGIES: Allergies  Allergen Reactions  . Penicillins Rash    HOME MEDICATIONS: Current Outpatient Medications  Medication Sig Dispense Refill  . atorvastatin (LIPITOR) 20 MG tablet Take 20 mg by mouth daily.     . carbidopa-levodopa (SINEMET IR) 25-100 MG tablet Take 1 tablet by mouth 3 (three) times daily. 90 tablet 6  . Cholecalciferol (VITAMIN D) 2000 units CAPS Take by mouth daily.    . meloxicam (MOBIC) 15 MG tablet Take 1 tablet by mouth daily as needed.    . Multiple Vitamins-Minerals (WOMENS MULTIVITAMIN PO) Take by mouth daily.    . Omega-3 Fatty Acids (FISH OIL) 1200 MG CAPS Take 1,200 mg by mouth daily.    Marland Kitchen oxybutynin (DITROPAN) 5 MG tablet Take 5 mg by mouth 1 day or 1 dose.    Marland Kitchen PARoxetine (PAXIL) 40 MG tablet Take 40 mg by mouth daily.      No current facility-administered medications for this visit.    PAST MEDICAL HISTORY: Past Medical History:  Diagnosis Date  . Depression with anxiety   . GERD (gastroesophageal reflux disease)   . Hyperplastic colon polyp   . Melanoma ([place]) 2012  . Mixed dyslipidemia   . Tremor   . Venous insufficiency of both lower extremities   . Vitamin D deficiency     PAST SURGICAL HISTORY: Past Surgical History:  Procedure Laterality Date  .  BUNIONECTOMY Left   . CATARACT EXTRACTION Bilateral   . MELANOMA EXCISION  2012    FAMILY HISTORY: Family History  Problem Relation Age of Onset  . Multiple myeloma Mother   . Anemia Father     SOCIAL HISTORY: Social History   Socioeconomic History  . Marital status: Married    Spouse name: Not on file  . Number of children: 3  . Years of education: Bachelors  . Highest education level: Not on file  Occupational History  . Occupation: Retired  Tobacco Use  . Smoking status: Never Smoker  . Smokeless tobacco: Never Used  Vaping Use  . Vaping Use: Never used  Substance and Sexual Activity  . Alcohol use: No  . Drug use: No  . Sexual activity: Not on file  Other Topics Concern  . Not on file  Social History Narrative   Lives at home with husband.   Left-handed.   No caffeine use.   Social Determinants of Health   Financial Resource Strain:   . Difficulty of Paying Living Expenses:   Food Insecurity:   . Worried About Charity fundraiser in the Last Year:   . Arboriculturist in the Last Year:   Transportation Needs:   . Film/video editor (Medical):   Marland Kitchen Lack of Transportation (Non-Medical):   Physical Activity:   . Days of Exercise per Week:   . Minutes of Exercise per Session:   Stress:   . Feeling of Stress :   Social Connections:   . Frequency of Communication with Friends and Family:   . Frequency of Social Gatherings with Friends and Family:   . Attends Religious Services:   . Active Member of Clubs or Organizations:   . Attends Archivist Meetings:   Marland Kitchen Marital Status:   Intimate Partner Violence:   . Fear of Current or Ex-Partner:   . Emotionally Abused:   Marland Kitchen Physically Abused:   . Sexually Abused:      PHYSICAL EXAM   Vitals:   07/02/20 1008  BP: 115/78  Pulse: 76  Weight: 161 lb (73 kg)  Height: '5\' 7"'  (1.702 m)   Not recorded     Body mass index is 25.22 kg/m.  PHYSICAL EXAMNIATION:  Gen: NAD, conversant, well  nourised, well groomed                     Cardiovascular: Regular rate rhythm, no peripheral edema, warm, nontender. Eyes: Conjunctivae clear without exudates or hemorrhage Neck: Supple, no carotid bruits. Pulmonary: Clear to auscultation bilaterally   NEUROLOGICAL EXAM:  MMSE - Mini Mental State Exam 03/24/2020  Orientation to time 5  Orientation to Place 5  Registration 3  Attention/ Calculation 3  Recall 1  Language- name 2 objects 2  Language- repeat 1  Language- follow 3 step command 3  Language- read & follow direction 1  Write a sentence 1  Copy design 1  Total score 26     CRANIAL NERVES: CN II: Visual fields are full to confrontation. Pupils are round equal and briskly reactive  to light. CN III, IV, VI: extraocular movement are normal. No ptosis. CN V: Facial sensation is intact to light touch CN VII: Face is symmetric with normal eye closure  CN VIII: Hearing is normal to causal conversation. CN IX, X: Phonation is normal. CN XI: Head turning and shoulder shrug are intact  MOTOR: She has constant bilateral hands resting tremor, no weakness, left more than upper and lower extremity rigidity, bradykinesia,  REFLEXES: Reflexes are 3 and symmetric at the biceps, triceps, knees, and ankles. Plantar responses are flexor.  SENSORY: Intact to light touch, pinprick and vibratory sensation are intact in fingers and toes.  COORDINATION: There is no trunk or limb dysmetria noted.  GAIT/STANCE: She can get up from seated position arms crossed, moderate stride, no significant en bloc turning, mild retropulsed instability, the examination was taken 140 minutes after her morning dose of sentiment   DIAGNOSTIC DATA (LABS, IMAGING, TESTING) - I reviewed patient records, labs, notes, testing and imaging myself where available.   ASSESSMENT AND PLAN  Laberta A Slagel is a 74 y.o. female   Idiopathic Parkinson's disease Mild cognitive impairment  Mini-Mental Status  Examination 26/30  Datscan in May 2021, confirmed decreased radiotracer activity at bilateral stratum, right worse than left  Moderate response to Sinemet 25/100 mg 3 times a day  Laboratory evaluation TSH, B12 to rule out treatable etiology  MRI of brain  Gait abnormality urinary urgency,  She has hyperreflexia on examination,  My of cervical spine to rule out cervical spondylitic myelopathy  Marcial Pacas, M.D. Ph.D.  [facility] [address], [place],  [address] Ph: [phone] Fax: [phone]  CC: Hulan Fess, MD

## 2020-07-03 LAB — TSH: TSH: 2.57 u[IU]/mL (ref 0.450–4.500)

## 2020-07-03 LAB — VITAMIN B12: Vitamin B-12: 719 pg/mL (ref 232–1245)

## 2020-07-03 NOTE — Telephone Encounter (Signed)
[employer] update Auth: [id number] (exp. 07/11/20 to 08/10/20) scheduled at GI for 07/11/20.

## 2020-07-06 ENCOUNTER — Telehealth: Payer: Self-pay | Admitting: Neurology

## 2020-07-06 NOTE — Telephone Encounter (Signed)
-----   Message from Darleen Crocker, RN sent at 07/06/2020  9:59 AM EDT -----  ----- Message ----- From: Marcial Pacas, MD Sent: 07/03/2020  10:32 AM EDT To: Desmond Lope, RN  Please call patient for normal laboratory result

## 2020-07-06 NOTE — Telephone Encounter (Signed)
Called the pt at the home number and then the cell. LVM on cell # per [facility] instruction. LVM advising of the normal lab findings. Instructed her to call with any questions.  **IF pt returns call please advise that the lab results from Dr Krista Blue were in normal ranges and there was nothing concerning.

## 2020-07-07 NOTE — Telephone Encounter (Signed)
Patient called back relayed labs were in normal range Dr. Krista Blue didn't see anything concerning . Patient understood details .

## 2020-07-11 ENCOUNTER — Ambulatory Visit
Admission: RE | Admit: 2020-07-11 | Discharge: 2020-07-11 | Disposition: A | Discharge status: Home / Self Care | Payer: Medicare PPO | Source: Ambulatory Visit | Attending: Neurology | Admitting: Neurology

## 2020-07-11 ENCOUNTER — Other Ambulatory Visit: Payer: Self-pay

## 2020-07-11 DIAGNOSIS — R413 Other amnesia: Secondary | ICD-10-CM

## 2020-07-11 DIAGNOSIS — G20A1 Parkinson's disease without dyskinesia, without mention of fluctuations: Secondary | ICD-10-CM

## 2020-07-11 DIAGNOSIS — R269 Unspecified abnormalities of gait and mobility: Secondary | ICD-10-CM | POA: Diagnosis not present

## 2020-07-11 DIAGNOSIS — G20 Parkinson's disease: Secondary | ICD-10-CM | POA: Diagnosis not present

## 2020-07-13 ENCOUNTER — Telehealth: Payer: Self-pay | Admitting: Neurology

## 2020-07-13 NOTE — Telephone Encounter (Signed)
IMPRESSION:   MRI brain (without) demonstrating: - Scattered periventricular and subcortical foci of non-specific gliosis.  - No acute findings.   IMPRESSION:   MRI cervical spine (without) demonstrating: - At C4-5, C5-6: disc bulging and uncovertebral joint hypertrophy with mild spinal stenosis and severe biforaminal stenosis. - At C3-4: disc bulging and uncovertebral joint hypertrophy with moderate biforaminal stenosis.  Please call patient, MRI of the brain showed mild age-related changes, no acute abnormality, MRI of cervical spine showed multilevel degenerative changes, most noticeable at C3-4, with evidence of moderate foraminal stenosis, no evidence of spinal cord compression

## 2020-07-13 NOTE — Telephone Encounter (Signed)
I spoke to the patient and provided her with the results below. She verbalized understanding.

## 2020-08-18 DIAGNOSIS — H524 Presbyopia: Secondary | ICD-10-CM | POA: Diagnosis not present

## 2020-08-18 DIAGNOSIS — H40013 Open angle with borderline findings, low risk, bilateral: Secondary | ICD-10-CM | POA: Diagnosis not present

## 2020-08-21 DIAGNOSIS — F338 Other recurrent depressive disorders: Secondary | ICD-10-CM | POA: Diagnosis not present

## 2020-08-21 DIAGNOSIS — Z79899 Other long term (current) drug therapy: Secondary | ICD-10-CM | POA: Diagnosis not present

## 2020-08-21 DIAGNOSIS — E782 Mixed hyperlipidemia: Secondary | ICD-10-CM | POA: Diagnosis not present

## 2020-08-26 DIAGNOSIS — L570 Actinic keratosis: Secondary | ICD-10-CM | POA: Diagnosis not present

## 2020-08-26 DIAGNOSIS — Z85820 Personal history of malignant melanoma of skin: Secondary | ICD-10-CM | POA: Diagnosis not present

## 2020-08-26 DIAGNOSIS — L821 Other seborrheic keratosis: Secondary | ICD-10-CM | POA: Diagnosis not present

## 2020-08-26 DIAGNOSIS — Z85828 Personal history of other malignant neoplasm of skin: Secondary | ICD-10-CM | POA: Diagnosis not present

## 2020-08-27 DIAGNOSIS — G20 Parkinson's disease: Secondary | ICD-10-CM | POA: Diagnosis not present

## 2020-08-27 DIAGNOSIS — Z85820 Personal history of malignant melanoma of skin: Secondary | ICD-10-CM | POA: Diagnosis not present

## 2020-08-27 DIAGNOSIS — E782 Mixed hyperlipidemia: Secondary | ICD-10-CM | POA: Diagnosis not present

## 2020-08-27 DIAGNOSIS — N289 Disorder of kidney and ureter, unspecified: Secondary | ICD-10-CM | POA: Diagnosis not present

## 2020-08-27 DIAGNOSIS — H6121 Impacted cerumen, right ear: Secondary | ICD-10-CM | POA: Diagnosis not present

## 2020-08-27 DIAGNOSIS — R8290 Unspecified abnormal findings in urine: Secondary | ICD-10-CM | POA: Diagnosis not present

## 2020-08-27 DIAGNOSIS — G3184 Mild cognitive impairment, so stated: Secondary | ICD-10-CM | POA: Diagnosis not present

## 2020-08-27 DIAGNOSIS — F338 Other recurrent depressive disorders: Secondary | ICD-10-CM | POA: Diagnosis not present

## 2020-08-27 DIAGNOSIS — Z0000 Encounter for general adult medical examination without abnormal findings: Secondary | ICD-10-CM | POA: Diagnosis not present

## 2020-10-08 DIAGNOSIS — Z124 Encounter for screening for malignant neoplasm of cervix: Secondary | ICD-10-CM | POA: Diagnosis not present

## 2020-10-08 DIAGNOSIS — Z6825 Body mass index (BMI) 25.0-25.9, adult: Secondary | ICD-10-CM | POA: Diagnosis not present

## 2020-10-08 DIAGNOSIS — Z01419 Encounter for gynecological examination (general) (routine) without abnormal findings: Secondary | ICD-10-CM | POA: Diagnosis not present

## 2020-12-02 DIAGNOSIS — M545 Low back pain, unspecified: Secondary | ICD-10-CM | POA: Diagnosis not present

## 2020-12-02 DIAGNOSIS — R899 Unspecified abnormal finding in specimens from other organs, systems and tissues: Secondary | ICD-10-CM | POA: Diagnosis not present

## 2021-01-05 ENCOUNTER — Ambulatory Visit: Payer: Medicare PPO | Admitting: Neurology

## 2021-01-05 ENCOUNTER — Encounter: Payer: Self-pay | Admitting: Neurology

## 2021-01-05 VITALS — BP 145/81 | HR 71 | Ht 67.0 in | Wt 155.0 lb

## 2021-01-05 DIAGNOSIS — R269 Unspecified abnormalities of gait and mobility: Secondary | ICD-10-CM | POA: Diagnosis not present

## 2021-01-05 DIAGNOSIS — G20 Parkinson's disease: Secondary | ICD-10-CM

## 2021-01-05 NOTE — Patient Instructions (Signed)
Encourage to continue exercise! Check into rock steady boxing program  Continue Sinemet at current dosing See you back in 6 months

## 2021-01-05 NOTE — Progress Notes (Signed)
PATIENT: Desiree Allen DOB: 1946/03/25  REASON FOR VISIT: follow up HISTORY FROM: patient  HISTORY OF PRESENT ILLNESS: Today 01/05/21  HISTORY Desiree Allen is a 75 year old female, seen in request by her primary care physician Little, Lennette Bihari for evaluation of bilateral hands tremor, initial evaluation was on March 25, 2020.  I have reviewed and summarized the referring note from the referring physician.  She has past medical history of anxiety, taking Paxil 40 mg daily, is a retired Airline pilot, she also had a history of right arm melanoma resection in 2012.  I saw her initially in November 2017 for bilateral hands tremor, she did complaints lost sense of smell at that time, also vivid dreams, but no excessive movement during her sleep, there was no family history of tremor.  At that visit, she was noted to have fairly symmetric bilateral hands posturing tremor, was diagnosed with possible essential tremor, she denies significant functional limitation in her daily activity  Over the years, bilateral hands tremor gradually getting worse, now she also complains of mild gait abnormality, especially since her fall by accident in November 2020, she also complains of stress incontinence, frequent nocturia, which has helped by oxybutynin  She continues to notice decreased sense of smell, no REM sleep disorder, she also concerned about her gradual onset mild memory loss,  Today's examination showed clearly parkinsonian features, mild bilateral upper extremity rigidity, bradykinesia, but fairly symmetric resting tremor, decreased bilateral arm swing, ambulate  UPDATE July 8th 2021 She reported 60% improvement after starting Sinemet 25/100 mg 3 times a day, she can walk better, less tremor, she did occasionally noticed end dose wearing off, discussed formal diagnosis of Parkinson's disease  Laboratory evaluation from primary care in September 2020 normal CBC, hemoglobin  of 14.2, LDL 87, CMP  Personally reviewed DAT scan May 2021  Decrease radiotracer activity within the LEFT and RIGHT stratum is a pattern suggestive of Parkinson's syndrome pathology.  Greater loss of dopamine transport populations on the RIGHT compared to the LEFT.  She continue complains of mild memory loss, urinary incontinence, hyperreflexia on examination,   Update January 05, 2021 SS: Here today alone, overall doing well.  On Sinemet 25/100 mg, 1 tablet 3 times a day, usually 8 am, 1 pm, 6 pm.  No noted frequency, or wearing off.  Bilateral hand tremor, notices more in left than right, since left handed.  No falls, with Sinemet, more confident in walking.  Does excellent to exercise either walking, or riding stationary bike silver sneakers twice a week.  Still trouble with short-term memory.  Seeing urology next week for urinary urgency.  Remains active at home, does the housework, drives. Saw her mother in law go through PD. No neck pain, or symptoms down the arms. MMSE 25/30 today.  TSH, B12 are normal.  MRI cervical spine (without) demonstrating: - At C4-5, C5-6: disc bulging and uncovertebral joint hypertrophy with mild spinal stenosis and severe biforaminal stenosis. - At C3-4: disc bulging and uncovertebral joint hypertrophy with moderate biforaminal stenosis.  MRI brain (without) demonstrating: - Scattered periventricular and subcortical foci of non-specific gliosis.  - No acute findings.  REVIEW OF SYSTEMS: Out of a complete 14 system review of symptoms, the patient complains only of the following symptoms, and all other reviewed systems are negative.  Tremor  ALLERGIES: Allergies  Allergen Reactions  . Penicillins Rash    HOME MEDICATIONS: Outpatient Medications Prior to Visit  Medication Sig Dispense Refill  .  atorvastatin (LIPITOR) 20 MG tablet Take 20 mg by mouth daily.     . carbidopa-levodopa (SINEMET IR) 25-100 MG tablet Take 1 tablet by mouth 3 (three)  times daily. 270 tablet 4  . Cholecalciferol (VITAMIN D) 2000 units CAPS Take by mouth daily.    . Multiple Vitamins-Minerals (WOMENS MULTIVITAMIN PO) Take by mouth daily.    . Omega-3 Fatty Acids (FISH OIL) 1200 MG CAPS Take 1,200 mg by mouth daily.    Marland Kitchen PARoxetine (PAXIL) 40 MG tablet Take 40 mg by mouth daily.     Marland Kitchen tolterodine (DETROL LA) 2 MG 24 hr capsule tolterodine ER 2 mg capsule,extended release 24 hr  TAKE ONE CAPSULE BY MOUTH ONE TIME DAILY at noon    . meloxicam (MOBIC) 15 MG tablet Take 1 tablet by mouth daily as needed.    Marland Kitchen oxybutynin (DITROPAN) 5 MG tablet Take 5 mg by mouth 1 day or 1 dose.     No facility-administered medications prior to visit.    PAST MEDICAL HISTORY: Past Medical History:  Diagnosis Date  . Depression with anxiety   . GERD (gastroesophageal reflux disease)   . Hyperplastic colon polyp   . Melanoma ([place]) 2012  . Mixed dyslipidemia   . Tremor   . Venous insufficiency of both lower extremities   . Vitamin D deficiency     PAST SURGICAL HISTORY: Past Surgical History:  Procedure Laterality Date  . BUNIONECTOMY Left   . CATARACT EXTRACTION Bilateral   . MELANOMA EXCISION  2012    FAMILY HISTORY: Family History  Problem Relation Age of Onset  . Multiple myeloma Mother   . Anemia Father     SOCIAL HISTORY: Social History   Socioeconomic History  . Marital status: Married    Spouse name: Not on file  . Number of children: 3  . Years of education: Bachelors  . Highest education level: Not on file  Occupational History  . Occupation: Retired  Tobacco Use  . Smoking status: Never Smoker  . Smokeless tobacco: Never Used  Vaping Use  . Vaping Use: Never used  Substance and Sexual Activity  . Alcohol use: No  . Drug use: No  . Sexual activity: Not on file  Other Topics Concern  . Not on file  Social History Narrative   Lives at home with husband.   Left-handed.   No caffeine use.   Social Determinants of Health   Financial  Resource Strain: Not on file  Food Insecurity: Not on file  Transportation Needs: Not on file  Physical Activity: Not on file  Stress: Not on file  Social Connections: Not on file  Intimate Partner Violence: Not on file   PHYSICAL EXAM  Vitals:   01/05/21 1110  BP: (!) 145/81  Pulse: 71  Weight: 155 lb (70.3 kg)  Height: '5\' 7"'  (1.702 m)   Body mass index is 24.28 kg/m.  Generalized: Well developed, in no acute distress  MMSE - Mini Mental State Exam 01/05/2021 03/24/2020  Orientation to time 4 5  Orientation to Place 5 5  Registration 3 3  Attention/ Calculation 3 3  Recall 1 1  Language- name 2 objects 2 2  Language- repeat 1 1  Language- follow 3 step command 3 3  Language- read & follow direction 1 1  Write a sentence 1 1  Copy design 1 1  Total score 25 26    Neurological examination  Mentation: Alert oriented to time, place, history taking. Follows all  commands speech and language fluent, mild masking of the face. Cranial nerve II-XII: Pupils were equal round reactive to light. Extraocular movements were full, visual field were full on confrontational test. Facial sensation and strength were normal. Head turning and shoulder shrug  were normal and symmetric. Motor: Bilateral resting tremor noted, mild, overall muscle strength is intact, noted right more than left upper extremity rigidity, no significant bradykinesia Sensory: Sensory testing is intact to soft touch on all 4 extremities. No evidence of extinction is noted.  Coordination: Cerebellar testing reveals good finger-nose-finger and heel-to-shin bilaterally.  Gait and station: Able to stand from seated position with arms crossed, moderate stride, symmetric arm swing, steady Reflexes: Deep tendon reflexes are symmetric but increased throughout  DIAGNOSTIC DATA (LABS, IMAGING, TESTING) - I reviewed patient records, labs, notes, testing and imaging myself where available.  No results found for: WBC, HGB, HCT,  MCV, PLT No results found for: NA, K, CL, CO2, GLUCOSE, BUN, CREATININE, CALCIUM, PROT, ALBUMIN, AST, ALT, ALKPHOS, BILITOT, GFRNONAA, GFRAA No results found for: CHOL, HDL, LDLCALC, LDLDIRECT, TRIG, CHOLHDL No results found for: HGBA1C Lab Results  Component Value Date   VITAMINB12 719 07/02/2020   Lab Results  Component Value Date   TSH 2.570 07/02/2020   ASSESSMENT AND PLAN 75 y.o. year old female  has a past medical history of Depression with anxiety, GERD (gastroesophageal reflux disease), Hyperplastic colon polyp, Melanoma ([place]) (2012), Mixed dyslipidemia, Tremor, Venous insufficiency of both lower extremities, and Vitamin D deficiency. here with:  1.  Idiopathic Parkinson's disease 2.  Mild cognitive impairment -MMSE was 25/30 -Datscan in May 2021 confirmed decreased radiotracer activity at bilateral stratum right worse than left -Moderate response to Sinemet 25/100 mg, 1 tablet 3 times daily, will continue at this dose  -B12, TSH were normal -MRI of the brain showed mild age-related changes, no acute abnormality -Encouraged to continue consistent exercise, activity  -Follow-up in 6 months or sooner if needed  3.  Gait abnormality, urinary urgency -Hyperreflexia on examination -MRI cervical spine showed multilevel degenerative changes, C4-5, C5-6 mild stenosis, severe bi foraminal stenosis (reviewed with Dr. Krista Blue, will follow at next visit, clinically doing very well) MRI cervical spine (without) demonstrating: - At C4-5, C5-6: disc bulging and uncovertebral joint hypertrophy with mild spinal stenosis and severe biforaminal stenosis. - At C3-4: disc bulging and uncovertebral joint hypertrophy with moderate biforaminal stenosis  I spent 30 minutes of face-to-face and non-face-to-face time with patient.  This included previsit chart review, lab review, study review, order entry, electronic health record documentation, patient education.  Butler Denmark, AGNP-C, DNP 01/05/2021, 12:14  PM [facility] [address] [place], [place] [address] [phone]

## 2021-01-15 DIAGNOSIS — N3944 Nocturnal enuresis: Secondary | ICD-10-CM | POA: Diagnosis not present

## 2021-01-15 DIAGNOSIS — R350 Frequency of micturition: Secondary | ICD-10-CM | POA: Diagnosis not present

## 2021-01-15 DIAGNOSIS — N3946 Mixed incontinence: Secondary | ICD-10-CM | POA: Diagnosis not present

## 2021-01-15 DIAGNOSIS — R351 Nocturia: Secondary | ICD-10-CM | POA: Diagnosis not present

## 2021-01-18 ENCOUNTER — Telehealth: Payer: Self-pay | Admitting: Neurology

## 2021-01-18 NOTE — Telephone Encounter (Signed)
I called pt and LMVM for her that I called back.

## 2021-01-18 NOTE — Telephone Encounter (Signed)
Pt called wanting to discuss with RN the possible changes to her carbidopa-levodopa (SINEMET IR) 25-100 MG tablet that were discussed in her last appt. Please advise.

## 2021-01-19 NOTE — Telephone Encounter (Signed)
I called pt and relayed message that for her PD tremors, she may increase the sinemet 25/100 to 1.5 po tid. If noted no benefit then may go back to 1 po tid.  She should be in touch with pcp re: depression and med management for this or referral to psychiatry.  If she has worsening sx she may go back to 1 tab po tid. As well.  She verbalized understanding.

## 2021-01-19 NOTE — Telephone Encounter (Signed)
Called pt and she has expressed increase in her CL for her depression that she is experiencing at this time.   She is on paxil 40mg  po daily.  CL is 25/100 po TID.  She has been getting the paxil from her pcp and feels that she is feeling alittle more depressed lately and thought this would help if CL increased since her tremors are no better.  Please advise.

## 2021-01-19 NOTE — Telephone Encounter (Signed)
I may not understand the message correctly, but not sure the Sinemet will impact depression, is she wants to try higher dose of Sinemet for tremor I suppose we can, but tremor can be hardest symptom to treat. Can try Sinemet 25-100 1.5 tablets 3 times daily, always return back to previous dose if no benefit.

## 2021-01-22 DIAGNOSIS — N3946 Mixed incontinence: Secondary | ICD-10-CM | POA: Diagnosis not present

## 2021-01-22 DIAGNOSIS — R350 Frequency of micturition: Secondary | ICD-10-CM | POA: Diagnosis not present

## 2021-01-22 DIAGNOSIS — N3944 Nocturnal enuresis: Secondary | ICD-10-CM | POA: Diagnosis not present

## 2021-01-28 NOTE — Progress Notes (Signed)
I have reviewed and agreed above plan. 

## 2021-02-03 ENCOUNTER — Emergency Department (HOSPITAL_COMMUNITY)
Admission: EM | Admit: 2021-02-03 | Discharge: 2021-02-03 | Disposition: A | Discharge status: Home / Self Care | Payer: Medicare PPO | Attending: Emergency Medicine | Admitting: Emergency Medicine

## 2021-02-03 ENCOUNTER — Other Ambulatory Visit: Payer: Self-pay

## 2021-02-03 ENCOUNTER — Emergency Department (HOSPITAL_COMMUNITY): Payer: Medicare PPO

## 2021-02-03 ENCOUNTER — Encounter (HOSPITAL_COMMUNITY): Payer: Self-pay

## 2021-02-03 DIAGNOSIS — R55 Syncope and collapse: Secondary | ICD-10-CM

## 2021-02-03 DIAGNOSIS — R109 Unspecified abdominal pain: Secondary | ICD-10-CM | POA: Diagnosis not present

## 2021-02-03 DIAGNOSIS — Y9301 Activity, walking, marching and hiking: Secondary | ICD-10-CM | POA: Insufficient documentation

## 2021-02-03 DIAGNOSIS — M25552 Pain in left hip: Secondary | ICD-10-CM | POA: Insufficient documentation

## 2021-02-03 DIAGNOSIS — Y92009 Unspecified place in unspecified non-institutional (private) residence as the place of occurrence of the external cause: Secondary | ICD-10-CM | POA: Diagnosis not present

## 2021-02-03 DIAGNOSIS — R0789 Other chest pain: Secondary | ICD-10-CM | POA: Insufficient documentation

## 2021-02-03 DIAGNOSIS — R102 Pelvic and perineal pain: Secondary | ICD-10-CM | POA: Diagnosis not present

## 2021-02-03 DIAGNOSIS — W19XXXA Unspecified fall, initial encounter: Secondary | ICD-10-CM | POA: Insufficient documentation

## 2021-02-03 DIAGNOSIS — G20 Parkinson's disease: Secondary | ICD-10-CM | POA: Insufficient documentation

## 2021-02-03 DIAGNOSIS — Z79899 Other long term (current) drug therapy: Secondary | ICD-10-CM | POA: Insufficient documentation

## 2021-02-03 DIAGNOSIS — R42 Dizziness and giddiness: Secondary | ICD-10-CM | POA: Diagnosis present

## 2021-02-03 DIAGNOSIS — E860 Dehydration: Secondary | ICD-10-CM | POA: Diagnosis not present

## 2021-02-03 DIAGNOSIS — S299XXA Unspecified injury of thorax, initial encounter: Secondary | ICD-10-CM | POA: Diagnosis not present

## 2021-02-03 LAB — URINALYSIS, ROUTINE W REFLEX MICROSCOPIC
Bilirubin Urine: NEGATIVE
Glucose, UA: NEGATIVE mg/dL
Hgb urine dipstick: NEGATIVE
Ketones, ur: NEGATIVE mg/dL
Leukocytes,Ua: NEGATIVE
Nitrite: NEGATIVE
Protein, ur: NEGATIVE mg/dL
Specific Gravity, Urine: <1.005 — ABNORMAL LOW (ref 1.005–1.030)
pH: 6 (ref 5.0–8.0)

## 2021-02-03 LAB — BASIC METABOLIC PANEL
Anion gap: 10 (ref 5–15)
BUN: 21 mg/dL (ref 8–23)
CO2: 26 mmol/L (ref 22–32)
Calcium: 10.1 mg/dL (ref 8.9–10.3)
Chloride: 104 mmol/L (ref 98–111)
Creatinine, Ser: 0.96 mg/dL (ref 0.44–1.00)
GFR, Estimated: >60 mL/min (ref >60)
Glucose, Bld: 98 mg/dL (ref 70–99)
Potassium: 4.4 mmol/L (ref 3.5–5.1)
Sodium: 140 mmol/L (ref 135–145)

## 2021-02-03 LAB — CBC
HCT: 42.5 % (ref 36.0–46.0)
Hemoglobin: 13.9 g/dL (ref 12.0–15.0)
MCH: 32.1 pg (ref 26.0–34.0)
MCHC: 32.7 g/dL (ref 30.0–36.0)
MCV: 98.2 fL (ref 80.0–100.0)
Platelets: 210 10*3/uL (ref 150–400)
RBC: 4.33 MIL/uL (ref 3.87–5.11)
RDW: 13.1 % (ref 11.5–15.5)
WBC: 8.2 10*3/uL (ref 4.0–10.5)
nRBC: 0 % (ref 0.0–0.2)

## 2021-02-03 LAB — LIPASE, BLOOD: Lipase: 39 U/L (ref 11–51)

## 2021-02-03 LAB — TSH: TSH: 2.939 u[IU]/mL (ref 0.350–4.500)

## 2021-02-03 MED ORDER — SODIUM CHLORIDE 0.9 % IV BOLUS
1000.0000 mL | Freq: Once | INTRAVENOUS | Status: AC
  Administered 2021-02-03: 1000 mL via INTRAVENOUS

## 2021-02-03 NOTE — ED Notes (Signed)
Patient transported to X-ray 

## 2021-02-03 NOTE — ED Provider Notes (Signed)
[facility] Provider Note   CSN: [id number] Arrival date & time: 02/03/21  1214     History Chief Complaint  Patient presents with  . Dizziness  . Fall    Desiree Allen is a 75 y.o. female.  The history is provided by the patient and medical records. No language interpreter was used.  Fall This is a new problem. The current episode started less than 1 hour ago. The problem occurs rarely. The problem has not changed since onset.Associated symptoms include chest pain (left lateral). Pertinent negatives include no abdominal pain, no headaches and no shortness of breath. The symptoms are aggravated by twisting. Nothing relieves the symptoms. She has tried nothing for the symptoms. The treatment provided no relief.       Past Medical History:  Diagnosis Date  . Depression with anxiety   . GERD (gastroesophageal reflux disease)   . Hyperplastic colon polyp   . Melanoma ([place]) 2012  . Mixed dyslipidemia   . Tremor   . Venous insufficiency of both lower extremities   . Vitamin D deficiency     Patient Active Problem List   Diagnosis Date Noted  . Memory loss 03/24/2020  . Parkinson's disease ([place]) 03/24/2020  . Gait abnormality 03/24/2020  . Pleural effusion on left 12/02/2019  . Essential tremor 11/21/2016    Past Surgical History:  Procedure Laterality Date  . BUNIONECTOMY Left   . CATARACT EXTRACTION Bilateral   . MELANOMA EXCISION  2012     OB History   No obstetric history on file.     Family History  Problem Relation Age of Onset  . Multiple myeloma Mother   . Anemia Father     Social History   Tobacco Use  . Smoking status: Never Smoker  . Smokeless tobacco: Never Used  Vaping Use  . Vaping Use: Never used  Substance Use Topics  . Alcohol use: No  . Drug use: No    Home Medications Prior to Admission medications   Medication Sig Start Date End Date Taking? Authorizing Provider  atorvastatin (LIPITOR) 20  MG tablet Take 20 mg by mouth daily.  09/29/16   [provider]  carbidopa-levodopa (SINEMET IR) 25-100 MG tablet Take 1 tablet by mouth 3 (three) times daily. 07/02/20   Marcial Pacas, MD  Cholecalciferol (VITAMIN D) 2000 units CAPS Take by mouth daily.    [provider]  Multiple Vitamins-Minerals (WOMENS MULTIVITAMIN PO) Take by mouth daily.    [provider]  Omega-3 Fatty Acids (FISH OIL) 1200 MG CAPS Take 1,200 mg by mouth daily.    [provider]  PARoxetine (PAXIL) 40 MG tablet Take 40 mg by mouth daily.  09/29/16   [provider]  tolterodine (DETROL LA) 2 MG 24 hr capsule tolterodine ER 2 mg capsule,extended release 24 hr  TAKE ONE CAPSULE BY MOUTH ONE TIME DAILY at noon    [provider]    Allergies    Penicillins  Review of Systems   Review of Systems  Constitutional: Negative for chills, diaphoresis, fatigue and fever.  HENT: Negative for congestion.   Eyes: Negative for visual disturbance.  Respiratory: Negative for cough, chest tightness, shortness of breath and wheezing.   Cardiovascular: Positive for chest pain (left lateral). Negative for palpitations.  Gastrointestinal: Negative for abdominal pain, constipation, diarrhea, nausea and vomiting.  Genitourinary: Positive for flank pain. Negative for dysuria and frequency.  Musculoskeletal: Positive for back pain. Negative for neck  pain and neck stiffness.  Skin: Negative for rash and wound.  Neurological: Positive for tremors and light-headedness. Negative for dizziness, speech difficulty, weakness, numbness and headaches.  Psychiatric/Behavioral: Negative for agitation and confusion.  All other systems reviewed and are negative.   Physical Exam Updated Vital Signs BP (!) 130/58 (BP Location: Left Arm)   Pulse 77   Temp 98.9 F (37.2 C) (Oral)   Resp 18   SpO2 100%   Physical Exam Vitals and nursing note reviewed.  Constitutional:      General: She is not in  acute distress.    Appearance: Normal appearance. She is well-developed and well-nourished. She is not ill-appearing, toxic-appearing or diaphoretic.  HENT:     Head: Normocephalic and atraumatic.     Nose: No congestion or rhinorrhea.     Mouth/Throat:     Mouth: Mucous membranes are dry.     Pharynx: No oropharyngeal exudate or posterior oropharyngeal erythema.  Eyes:     Extraocular Movements: Extraocular movements intact.     Conjunctiva/sclera: Conjunctivae normal.     Pupils: Pupils are equal, round, and reactive to light.  Cardiovascular:     Rate and Rhythm: Normal rate and regular rhythm.     Pulses: Normal pulses.     Heart sounds: No murmur heard.   Pulmonary:     Effort: Pulmonary effort is normal. No respiratory distress.     Breath sounds: Normal breath sounds. No wheezing, rhonchi or rales.  Chest:     Chest wall: Tenderness present.    Abdominal:     General: Abdomen is flat.     Palpations: Abdomen is soft.     Tenderness: There is no abdominal tenderness. There is no right CVA tenderness, left CVA tenderness, guarding or rebound.    Musculoskeletal:        General: Tenderness present. No edema.     Cervical back: Neck supple. No tenderness.     Thoracic back: Spasms and tenderness present.     Lumbar back: Tenderness present.       Back:     Left lower leg: No edema.  Skin:    General: Skin is warm and dry.     Capillary Refill: Capillary refill takes less than 2 seconds.     Findings: No erythema.  Neurological:     General: No focal deficit present.     Mental Status: She is alert. Mental status is at baseline.     Sensory: No sensory deficit.     Motor: No weakness.  Psychiatric:        Mood and Affect: Mood and affect and mood normal.     ED Results / Procedures / Treatments   Labs (all labs ordered are listed, but only abnormal results are displayed) Labs Reviewed  URINALYSIS, ROUTINE W REFLEX MICROSCOPIC - Abnormal; Notable for the  following components:      Result Value   Specific Gravity, Urine <1.005 (*)    All other components within normal limits  URINE CULTURE  BASIC METABOLIC PANEL  CBC  LIPASE, BLOOD  TSH    EKG EKG Interpretation  Date/Time:  Wednesday February 03 2021 12:57:30 EST Ventricular Rate:  77 PR Interval:  122 QRS Duration: 80 QT Interval:  360 QTC Calculation: 407 R Axis:   90 Text Interpretation: Normal sinus rhythm Right atrial enlargement Rightward axis Pulmonary disease pattern Abnormal ECG When comapred to prior, similar apperance with likely tremor. No STEMI Confirmed by Antony Blackbird [phone])  on 02/03/2021 4:06:39 PM   Radiology DG Ribs Unilateral W/Chest Left  Result Date: 02/03/2021 CLINICAL DATA:  Fall with trauma to the left chest. EXAM: LEFT RIBS AND CHEST - 3+ VIEW COMPARISON:  12/02/2019 FINDINGS: Heart and mediastinal shadows are normal. The lungs are clear. No pneumothorax or hemothorax. Left rib films do not show any visible fracture. IMPRESSION: No active cardiopulmonary disease. No visible rib fracture. Electronically Signed   By: Nelson Chimes M.D.   On: 02/03/2021 17:12   DG Hip Unilat W or Wo Pelvis 2-3 Views Left  Result Date: 02/03/2021 CLINICAL DATA:  Fall with left pelvic pain. EXAM: DG HIP (WITH OR WITHOUT PELVIS) 2-3V LEFT COMPARISON:  None. FINDINGS: There is no evidence of hip fracture or dislocation. There is no evidence of arthropathy or other focal bone abnormality. IMPRESSION: Negative. Electronically Signed   By: Nelson Chimes M.D.   On: 02/03/2021 17:13    Procedures Procedures   Medications Ordered in ED Medications  sodium chloride 0.9 % bolus 1,000 mL (0 mLs Intravenous Stopped 02/03/21 1923)    ED Course  I have reviewed the triage vital signs and the nursing notes.  Pertinent labs & imaging results that were available during my care of the patient were reviewed by me and considered in my medical decision making (see chart for details).    MDM  Rules/Calculators/A&P                          Desiree Allen is a 75 y.o. female with a past medical history significant for Parkinson's disease with baseline essential tremor, GERD, dyslipidemia, and anxiety/depression who presents for near syncope and fall.  Patient reports that she was sitting at home when she stood up to go to the kitchen.  She reports started walking and after several steps got lightheaded and went to the ground.  She reports he did not lose consciousness.  She denies any chest pain, palpitations, shortness of breath before the fall.  She thinks that she got unsteady and lightheaded with standing leading to her fall with her Parkinson's.  She reports lay on her left torso and has some pain in the left lateral chest wall and left flank area.  She also some pain in the left lower torso.  She denies any pain in the legs.  She denies any vision changes, nausea, vomiting, constipation, or diarrhea.  She reports she is chronically incontinent of urine and medications for this.  She denies any recent changes including no dysuria recently.  She denies fevers, chills, cough, URI symptoms, or Covid symptoms.  She denies any headache at this time.  No neck pain.  She otherwise is feeling well aside from significant anxiety for being in the emergency department.  She described it was a lightheaded episode as opposed to a true dizziness episode  On exam, patient's lungs were clear.  Anterior chest was nontender.  No murmur.  Abdomen was nontender.  Patient had some tenderness in her left lateral chest wall and her left superior pelvic/hip area.  She did not have any midline back tenderness.  Bowel sounds were appreciated.  Legs were nontender.  Normal sensation and strength in legs.  Essential tremor present with finger-nose-finger testing the patient reports it is at baseline.  Otherwise no focal neurologic deficits.  Pupils symmetric and reactive.  Dry mucous membranes on exam.  EKG shows no  STEMI.  Clinically I suspect patient had orthostatic near  syncopal episode after standing up and in the setting of her baseline instability with her Parkinson's and tremor, patient went to the ground.  I also suspect is primarily musculoskeletal and soft tissue injuries.  Her pain is primarily exacerbated by twisting and moving those muscles on the left torso and less so with all the palpation.  We had a discussion together and we will get some screening blood work including a urinalysis with her incontinence troubles as well as some screening labs.  We will give her some fluids for the likely orthostasis.  We will check orthostatics after fluids.  We had a discussion about imaging and agreed to get x-rays given her mild to moderate pain and somewhat reassuring exam without exquisite tenderness.  We did agree that if x-rays were concerning we will get CT scans we will start with x-rays at this time.  Patient agrees to hold on diffuse CT imaging.  She denies any preceding symptoms that would suggest a primarily cardiac cause of her near syncopal episode and fall.   If work-up is reassuring, anticipate discharge home.   8:40 PM Work-up returned reassuring.  After fluids patient was feeling much better and did not get lightheaded when she stood.  Urinalysis does not show infection.  Other labs all reassuring with no anemia or significant kidney abnormality.  I suspect she had a near syncopal episode with orthostasis after standing with dehydration in the setting of her Parkinson's.  Given her reassuring work-up and her desire to leave, we will get her discharged and ready to go home.  She will follow-up with her PCP in several days and maintain hydration.  She understands her strict return precautions and follow-up instructions.  She had  no questions or concerns and was discharged.   Final Clinical Impression(s) / ED Diagnoses Final diagnoses:  Near syncope  Fall in home, initial encounter   Dehydration    Rx / DC Orders ED Discharge Orders    None      Clinical Impression: 1. Near syncope   2. Fall in home, initial encounter   3. Dehydration     Disposition: Discharge  Condition: Good  I have discussed the results, Dx and Tx plan with the pt(& family if present). He/she/they expressed understanding and agree(s) with the plan. Discharge instructions discussed at great length. Strict return precautions discussed and pt &/or family have verbalized understanding of the instructions. No further questions at time of discharge.    New Prescriptions   No medications on file    Follow Up: Hulan Fess, MD [place] [address] [facility] [address] [id number] mc [place]       Tegeler, Gwenyth Allegra, MD 02/03/21 [id number]

## 2021-02-03 NOTE — ED Triage Notes (Signed)
Pt reports feeling dizzy and falling out at home, denies loc, hx of parkinsons. Pt c.o left sided flank/back pain from the fall, no other injuries. Pt alert and oriented at this time

## 2021-02-03 NOTE — Discharge Instructions (Signed)
Your work-up today was overall reassuring.  I suspect your fall to the ground was related to a near syncopal or lightheaded episode in the setting of mild dehydration and your Parkinson's.  Your work-up was extremely reassuring and your symptoms improved after some fluids.  You were able to stand without getting lightheaded or near syncope.  We feel you are safe for discharge home.  Please follow-up with your primary doctor and maintain hydration.  If any symptoms change or worsen, please return to the nearest emergency department.

## 2021-02-05 LAB — URINE CULTURE

## 2021-02-06 ENCOUNTER — Emergency Department (HOSPITAL_COMMUNITY)
Admission: EM | Admit: 2021-02-06 | Discharge: 2021-02-06 | Disposition: A | Discharge status: Home / Self Care | Payer: Medicare PPO | Attending: Emergency Medicine | Admitting: Emergency Medicine

## 2021-02-06 ENCOUNTER — Emergency Department (HOSPITAL_COMMUNITY): Payer: Medicare PPO

## 2021-02-06 ENCOUNTER — Other Ambulatory Visit: Payer: Self-pay

## 2021-02-06 ENCOUNTER — Encounter (HOSPITAL_COMMUNITY): Payer: Self-pay | Admitting: Emergency Medicine

## 2021-02-06 DIAGNOSIS — R519 Headache, unspecified: Secondary | ICD-10-CM | POA: Diagnosis not present

## 2021-02-06 DIAGNOSIS — I700 Atherosclerosis of aorta: Secondary | ICD-10-CM | POA: Diagnosis not present

## 2021-02-06 DIAGNOSIS — W19XXXA Unspecified fall, initial encounter: Secondary | ICD-10-CM | POA: Diagnosis not present

## 2021-02-06 DIAGNOSIS — Z79899 Other long term (current) drug therapy: Secondary | ICD-10-CM | POA: Insufficient documentation

## 2021-02-06 DIAGNOSIS — Q278 Other specified congenital malformations of peripheral vascular system: Secondary | ICD-10-CM | POA: Diagnosis not present

## 2021-02-06 DIAGNOSIS — Z85820 Personal history of malignant melanoma of skin: Secondary | ICD-10-CM | POA: Diagnosis not present

## 2021-02-06 DIAGNOSIS — R55 Syncope and collapse: Secondary | ICD-10-CM | POA: Insufficient documentation

## 2021-02-06 DIAGNOSIS — I951 Orthostatic hypotension: Secondary | ICD-10-CM | POA: Diagnosis not present

## 2021-02-06 DIAGNOSIS — G20 Parkinson's disease: Secondary | ICD-10-CM | POA: Insufficient documentation

## 2021-02-06 DIAGNOSIS — Y9301 Activity, walking, marching and hiking: Secondary | ICD-10-CM | POA: Diagnosis not present

## 2021-02-06 DIAGNOSIS — K314 Gastric diverticulum: Secondary | ICD-10-CM | POA: Diagnosis not present

## 2021-02-06 DIAGNOSIS — R42 Dizziness and giddiness: Secondary | ICD-10-CM | POA: Diagnosis not present

## 2021-02-06 DIAGNOSIS — S299XXA Unspecified injury of thorax, initial encounter: Secondary | ICD-10-CM | POA: Diagnosis present

## 2021-02-06 DIAGNOSIS — M545 Low back pain, unspecified: Secondary | ICD-10-CM | POA: Diagnosis not present

## 2021-02-06 DIAGNOSIS — S2232XA Fracture of one rib, left side, initial encounter for closed fracture: Secondary | ICD-10-CM | POA: Insufficient documentation

## 2021-02-06 LAB — BASIC METABOLIC PANEL
Anion gap: 11 (ref 5–15)
BUN: 22 mg/dL (ref 8–23)
CO2: 27 mmol/L (ref 22–32)
Calcium: 9.6 mg/dL (ref 8.9–10.3)
Chloride: 102 mmol/L (ref 98–111)
Creatinine, Ser: 1.18 mg/dL — ABNORMAL HIGH (ref 0.44–1.00)
GFR, Estimated: 48 mL/min — ABNORMAL LOW (ref >60)
Glucose, Bld: 120 mg/dL — ABNORMAL HIGH (ref 70–99)
Potassium: 4.1 mmol/L (ref 3.5–5.1)
Sodium: 140 mmol/L (ref 135–145)

## 2021-02-06 LAB — URINALYSIS, ROUTINE W REFLEX MICROSCOPIC
Glucose, UA: NEGATIVE mg/dL
Hgb urine dipstick: NEGATIVE
Ketones, ur: 20 mg/dL — AB
Leukocytes,Ua: NEGATIVE
Nitrite: NEGATIVE
Protein, ur: 30 mg/dL — AB
Specific Gravity, Urine: 1.038 — ABNORMAL HIGH (ref 1.005–1.030)
pH: 5 (ref 5.0–8.0)

## 2021-02-06 LAB — CBC
HCT: 41.2 % (ref 36.0–46.0)
Hemoglobin: 14 g/dL (ref 12.0–15.0)
MCH: 32.3 pg (ref 26.0–34.0)
MCHC: 34 g/dL (ref 30.0–36.0)
MCV: 95.2 fL (ref 80.0–100.0)
Platelets: 223 10*3/uL (ref 150–400)
RBC: 4.33 MIL/uL (ref 3.87–5.11)
RDW: 13.1 % (ref 11.5–15.5)
WBC: 8.6 10*3/uL (ref 4.0–10.5)
nRBC: 0 % (ref 0.0–0.2)

## 2021-02-06 LAB — CBG MONITORING, ED: Glucose-Capillary: 116 mg/dL — ABNORMAL HIGH (ref 70–99)

## 2021-02-06 MED ORDER — SODIUM CHLORIDE 0.9 % IV BOLUS
500.0000 mL | Freq: Once | INTRAVENOUS | Status: AC
  Administered 2021-02-06: 500 mL via INTRAVENOUS

## 2021-02-06 MED ORDER — IOHEXOL 350 MG/ML SOLN
75.0000 mL | Freq: Once | INTRAVENOUS | Status: AC | PRN
  Administered 2021-02-06: 75 mL via INTRAVENOUS

## 2021-02-06 MED ORDER — SODIUM CHLORIDE 0.9% FLUSH: 3.0000 mL | Freq: Once | INTRAVENOUS | Status: DC

## 2021-02-06 MED ORDER — SODIUM CHLORIDE 0.9 % IV BOLUS
1000.0000 mL | Freq: Once | INTRAVENOUS | Status: AC
  Administered 2021-02-06: 1000 mL via INTRAVENOUS

## 2021-02-06 NOTE — Discharge Instructions (Addendum)
As discussed, your blood pressure is dropping too low with position change. This is likely the cause of why you are losing consciousness when you stand. We recommended additional IV fluids and hospital stay to ensure this resolves properly. Please increase your daily water intake.  Please follow closely with your primary care provider. If your symptoms persist and you change your mind about a hospital stay or returning to the ED, please do so, we are happy to care for you once more.

## 2021-02-06 NOTE — ED Notes (Signed)
E-signature pad unavailable at time of pt discharge. This RN discussed discharge materials with pt and answered all pt questions. Pt stated understanding of discharge material. ? ?

## 2021-02-06 NOTE — ED Triage Notes (Signed)
Pt states she was in the ED on Wednesday after having a syncopal episode at home.  States she didn't have +LOC on Wednesday but had another episode today with + LOC.  Reports L hip pain from fall.  No neuro deficits noted.

## 2021-02-06 NOTE — ED Provider Notes (Signed)
[facility] EMERGENCY DEPARTMENT Provider Note   CSN: [id number] Arrival date & time: 02/06/21  1422     History Chief Complaint  Patient presents with  . Loss of Consciousness    Desiree Allen is a 75 y.o. female past medical history of Parkinson's disease, urinary incontinence, presenting to the emergency department after syncopal episode.  She had gotten up from a seated position after eating lunch.  She was walking towards the kitchen when her significant other heard her fall to the floor.  She thinks she may have hit her head though she is having any headache.  Her spouse was at her side immediately.  He states she came-to after very brief loss of consciousness.  She states she may have felt a little lightheaded prior to passing out though she did not have much warning.  She had similar episode on Wednesday where she had a near syncopal episode and was seen in the ED.  This was felt to be vasovagal and she was discharged after negative work-up and symptom improvement. She does admit to decreased p.o. fluid intake because of her urinary incontinence. She states the more she drinks more she has to urinate and she frequently does not make it to the bathroom in time. She tried multiple medications per urology which did not work.  The history is provided by the patient and the spouse.       Past Medical History:  Diagnosis Date  . Depression with anxiety   . GERD (gastroesophageal reflux disease)   . Hyperplastic colon polyp   . Melanoma ([place]) 2012  . Mixed dyslipidemia   . Tremor   . Venous insufficiency of both lower extremities   . Vitamin D deficiency     Patient Active Problem List   Diagnosis Date Noted  . Memory loss 03/24/2020  . Parkinson's disease ([place]) 03/24/2020  . Gait abnormality 03/24/2020  . Pleural effusion on left 12/02/2019  . Essential tremor 11/21/2016    Past Surgical History:  Procedure Laterality Date  . BUNIONECTOMY Left   .  CATARACT EXTRACTION Bilateral   . MELANOMA EXCISION  2012     OB History   No obstetric history on file.     Family History  Problem Relation Age of Onset  . Multiple myeloma Mother   . Anemia Father     Social History   Tobacco Use  . Smoking status: Never Smoker  . Smokeless tobacco: Never Used  Vaping Use  . Vaping Use: Never used  Substance Use Topics  . Alcohol use: No  . Drug use: No    Home Medications Prior to Admission medications   Medication Sig Start Date End Date Taking? Authorizing Provider  atorvastatin (LIPITOR) 20 MG tablet Take 20 mg by mouth daily.  09/29/16   [provider]  carbidopa-levodopa (SINEMET IR) 25-100 MG tablet Take 1 tablet by mouth 3 (three) times daily. 07/02/20   Marcial Pacas, MD  Cholecalciferol (VITAMIN D) 2000 units CAPS Take by mouth daily.    [provider]  Multiple Vitamins-Minerals (WOMENS MULTIVITAMIN PO) Take by mouth daily.    [provider]  Omega-3 Fatty Acids (FISH OIL) 1200 MG CAPS Take 1,200 mg by mouth daily.    [provider]  PARoxetine (PAXIL) 40 MG tablet Take 40 mg by mouth daily.  09/29/16   [provider]  tolterodine (DETROL LA) 2 MG 24 hr capsule tolterodine ER 2 mg capsule,extended release 24 hr  TAKE ONE CAPSULE BY MOUTH ONE TIME DAILY at noon    [provider]    Allergies    Penicillins  Review of Systems   Review of Systems  Respiratory: Negative for shortness of breath.   Cardiovascular: Negative for chest pain, palpitations and leg swelling.  Gastrointestinal: Negative for diarrhea and vomiting.  Neurological: Positive for syncope and light-headedness.  All other systems reviewed and are negative.   Physical Exam Updated Vital Signs BP (!) 168/97   Pulse 87   Temp 98.6 F (37 C) (Oral)   Resp 18   SpO2 98%   Physical Exam Vitals and nursing note reviewed.  Constitutional:      General: She is not in acute distress.    Appearance:  She is well-developed and well-nourished. She is not ill-appearing.  HENT:     Head: Normocephalic and atraumatic.  Eyes:     Extraocular Movements: Extraocular movements intact.     Conjunctiva/sclera: Conjunctivae normal.     Pupils: Pupils are equal, round, and reactive to light.  Cardiovascular:     Rate and Rhythm: Normal rate and regular rhythm.  Pulmonary:     Effort: Pulmonary effort is normal. No respiratory distress.     Breath sounds: Normal breath sounds.     Comments: TTP to inferior portion of left ribs. Tenderness extends towards the left flank. No bruising. Chest:     Chest wall: Tenderness present.  Abdominal:     General: Bowel sounds are normal.     Palpations: Abdomen is soft.     Tenderness: There is no abdominal tenderness.  Musculoskeletal:     Right lower leg: No edema.     Left lower leg: No edema.  Skin:    General: Skin is warm.  Neurological:     Mental Status: She is alert.     Comments: Speech is fluent without aphasia. No obvious facial droop. Equal strength and sensation. To bilateral upper and lower extremities. Normal tone.  Psychiatric:        Mood and Affect: Mood and affect normal.        Behavior: Behavior normal.     ED Results / Procedures / Treatments   Labs (all labs ordered are listed, but only abnormal results are displayed) Labs Reviewed  BASIC METABOLIC PANEL - Abnormal; Notable for the following components:      Result Value   Glucose, Bld 120 (*)    Creatinine, Ser 1.18 (*)    GFR, Estimated 48 (*)    All other components within normal limits  URINALYSIS, ROUTINE W REFLEX MICROSCOPIC - Abnormal; Notable for the following components:   Color, Urine AMBER (*)    APPearance HAZY (*)    Specific Gravity, Urine 1.038 (*)    Bilirubin Urine SMALL (*)    Ketones, ur 20 (*)    Protein, ur 30 (*)    Bacteria, UA RARE (*)    All other components within normal limits  CBG MONITORING, ED - Abnormal; Notable for the following  components:   Glucose-Capillary 116 (*)    All other components within normal limits  CBC    EKG EKG Interpretation  Date/Time:  Saturday February 06 2021 14:37:40 EST Ventricular Rate:  85 PR Interval:  128 QRS Duration: 80 QT Interval:  378 QTC Calculation: 449 R Axis:   85 Text Interpretation: Normal sinus rhythm Right atrial enlargement Septal infarct , age undetermined Abnormal ECG since last tracing no significant change Confirmed by  Noemi Chapel ([id number]) on 02/06/2021 4:33:48 PM   Radiology CT Angio Chest PE W/Cm &/Or Wo Cm  Result Date: 02/06/2021 CLINICAL DATA:  Syncope. Patient reports feeling dizzy and falling out at home. Denies loss of consciousness. Left-sided back and flank pain. EXAM: CT ANGIOGRAPHY CHEST CT ABDOMEN AND PELVIS WITH CONTRAST TECHNIQUE: Multidetector CT imaging of the chest was performed using the standard protocol during bolus administration of intravenous contrast. Multiplanar CT image reconstructions and MIPs were obtained to evaluate the vascular anatomy. Multidetector CT imaging of the abdomen and pelvis was performed using the standard protocol during bolus administration of intravenous contrast. CONTRAST:  9m OMNIPAQUE IOHEXOL 350 MG/ML SOLN COMPARISON:  CT a chest, 08/06/2006. FINDINGS: CTA CHEST FINDINGS Cardiovascular: Pulmonary arteries are well opacified. There is no evidence of a pulmonary embolism. Heart is normal in size and configuration. No pericardial effusion. Mild three-vessel coronary artery calcifications. Great vessels are normal in caliber. There is an aberrant right subclavian artery passing behind the esophagus. Minor aortic atherosclerosis. No dissection. Mediastinum/Nodes: No enlarged mediastinal, hilar, or axillary lymph nodes. Thyroid gland, trachea, and esophagus demonstrate no significant findings. Lungs/Pleura: Minimal dependent subsegmental atelectasis. Several areas of minor peripheral reticular opacity consistent with scarring  or atelectasis. Lungs otherwise clear. No pleural effusion or pneumothorax. Musculoskeletal: No fracture or bone lesion.  No chest wall masses. Review of the MIP images confirms the above findings. CT ABDOMEN and PELVIS FINDINGS Hepatobiliary: No focal liver abnormality is seen. No gallstones, gallbladder wall thickening, or biliary dilatation. Pancreas: Unremarkable. No pancreatic ductal dilatation or surrounding inflammatory changes. Spleen: Normal in size without focal abnormality. Adrenals/Urinary Tract: No adrenal masses. Mild right renal cortical thinning. Kidneys normal in overall size, orientation and position with symmetric enhancement and excretion. 11 mm cyst, upper pole the right kidney. No other masses, no stones and no hydronephrosis. Normal ureters. Bladder mostly decompressed, otherwise unremarkable. Stomach/Bowel: Posterior protrusion from the gastric cardia consistent with a diverticulum. Stomach otherwise unremarkable. Small bowel and colon are normal in caliber. No wall thickening. No inflammation. Mild to moderate generalized increase in the colonic and rectal stool burden. Normal appendix visualized Vascular/Lymphatic: Aortic atherosclerosis. No aneurysm. No enlarged lymph nodes. Reproductive: Uterus and bilateral adnexa are unremarkable. Other: No abdominal wall hernia.  No contusion.  No ascites. Musculoskeletal: Minimally displaced fracture of the posterior left twelfth rib. No other fractures. No bone lesions. Degenerative changes noted throughout the lumbar spine. Review of the MIP images confirms the above findings. IMPRESSION: CHEST CTA 1. No evidence of a pulmonary embolism. No aortic aneurysm or dissection. 2. No acute findings in the chest. 3. Minor aortic atherosclerosis. Incidental note made of an aberrant right subclavian artery. ABDOMEN AND PELVIS CT 1. Minimally displaced fracture of the posterior left twelfth rib. No other acute injury or finding within the abdomen or pelvis. 2.  Apparent diverticulum arising from the gastric cardia/fundus. 3. Aortic atherosclerosis. 4. Mild to moderate generalized increase in the colonic and rectal stool burden. Electronically Signed   By: DLajean ManesM.D.   On: 02/06/2021 18:52   CT Abdomen Pelvis W Contrast  Result Date: 02/06/2021 CLINICAL DATA:  Syncope. Patient reports feeling dizzy and falling out at home. Denies loss of consciousness. Left-sided back and flank pain. EXAM: CT ANGIOGRAPHY CHEST CT ABDOMEN AND PELVIS WITH CONTRAST TECHNIQUE: Multidetector CT imaging of the chest was performed using the standard protocol during bolus administration of intravenous contrast. Multiplanar CT image reconstructions and MIPs were obtained to evaluate the vascular anatomy. Multidetector CT imaging  of the abdomen and pelvis was performed using the standard protocol during bolus administration of intravenous contrast. CONTRAST:  39m OMNIPAQUE IOHEXOL 350 MG/ML SOLN COMPARISON:  CT a chest, 08/06/2006. FINDINGS: CTA CHEST FINDINGS Cardiovascular: Pulmonary arteries are well opacified. There is no evidence of a pulmonary embolism. Heart is normal in size and configuration. No pericardial effusion. Mild three-vessel coronary artery calcifications. Great vessels are normal in caliber. There is an aberrant right subclavian artery passing behind the esophagus. Minor aortic atherosclerosis. No dissection. Mediastinum/Nodes: No enlarged mediastinal, hilar, or axillary lymph nodes. Thyroid gland, trachea, and esophagus demonstrate no significant findings. Lungs/Pleura: Minimal dependent subsegmental atelectasis. Several areas of minor peripheral reticular opacity consistent with scarring or atelectasis. Lungs otherwise clear. No pleural effusion or pneumothorax. Musculoskeletal: No fracture or bone lesion.  No chest wall masses. Review of the MIP images confirms the above findings. CT ABDOMEN and PELVIS FINDINGS Hepatobiliary: No focal liver abnormality is seen. No  gallstones, gallbladder wall thickening, or biliary dilatation. Pancreas: Unremarkable. No pancreatic ductal dilatation or surrounding inflammatory changes. Spleen: Normal in size without focal abnormality. Adrenals/Urinary Tract: No adrenal masses. Mild right renal cortical thinning. Kidneys normal in overall size, orientation and position with symmetric enhancement and excretion. 11 mm cyst, upper pole the right kidney. No other masses, no stones and no hydronephrosis. Normal ureters. Bladder mostly decompressed, otherwise unremarkable. Stomach/Bowel: Posterior protrusion from the gastric cardia consistent with a diverticulum. Stomach otherwise unremarkable. Small bowel and colon are normal in caliber. No wall thickening. No inflammation. Mild to moderate generalized increase in the colonic and rectal stool burden. Normal appendix visualized Vascular/Lymphatic: Aortic atherosclerosis. No aneurysm. No enlarged lymph nodes. Reproductive: Uterus and bilateral adnexa are unremarkable. Other: No abdominal wall hernia.  No contusion.  No ascites. Musculoskeletal: Minimally displaced fracture of the posterior left twelfth rib. No other fractures. No bone lesions. Degenerative changes noted throughout the lumbar spine. Review of the MIP images confirms the above findings. IMPRESSION: CHEST CTA 1. No evidence of a pulmonary embolism. No aortic aneurysm or dissection. 2. No acute findings in the chest. 3. Minor aortic atherosclerosis. Incidental note made of an aberrant right subclavian artery. ABDOMEN AND PELVIS CT 1. Minimally displaced fracture of the posterior left twelfth rib. No other acute injury or finding within the abdomen or pelvis. 2. Apparent diverticulum arising from the gastric cardia/fundus. 3. Aortic atherosclerosis. 4. Mild to moderate generalized increase in the colonic and rectal stool burden. Electronically Signed   By: DLajean ManesM.D.   On: 02/06/2021 18:52    Procedures Procedures    Medications Ordered in ED Medications  sodium chloride flush (NS) 0.9 % injection 3 mL (3 mLs Intravenous Not Given 02/06/21 1654)  sodium chloride 0.9 % bolus 500 mL (0 mLs Intravenous Stopped 02/06/21 1902)  iohexol (OMNIPAQUE) 350 MG/ML injection 75 mL (75 mLs Intravenous Contrast Given 02/06/21 1832)  sodium chloride 0.9 % bolus 1,000 mL (0 mLs Intravenous Stopped 02/06/21 2212)    ED Course  I have reviewed the triage vital signs and the nursing notes.  Pertinent labs & imaging results that were available during my care of the patient were reviewed by me and considered in my medical decision making (see chart for details).  Clinical Course as of 02/06/21 2223  Sat Feb 06, 2021  2038 Patient's orthostatic VS observed by myself, significant abnormalities noted. Patient was not symptomatic. Had discussed with patient about IV hydration and recommendation for admission. Patient states she feels claustrophobic in the room and does  not want to stay. She states she will accept the IVF but then would like to go home. Discussed risks of leaving including serious injury and recurrence. Patient verbalized understanding. Attending physician Dr Sabra Heck made aware. [JR]    Clinical Course User Index [JR] Siomara Burkel, [place] N, PA-C   MDM Rules/Calculators/A&P                          Patient is 75 year old female with history of Parkinson's disease, presenting with syncopal episode. She syncopized after standing from a seated position. May have had a mild prodrome of lightheadedness. No chest pain or palpitations. She had similar episode on Wednesday with near syncope where she was evaluated in the ED and discharged with reassuring work-up.   She is normotensive and not tachycardic. No acute distress. No focal neuro deficits. She does have tenderness to the left flank which was caused from the fall on Wednesday. She has no new injuries from the fall today. Labs are reassuring, no anemia. Mild elevation  in creatinine. No hypoglycemia. UA with ketones present. Imaging of the chest abdomen pelvis is done to rule out PE as well as more significant intra-abdominal injury due to patients pain after fall on Wednesday. She is given IV fluids.  Orthostatics are significantly abnormal. While lying BP was 164/78 with heart rate of 89, while sitting BP was 136/64 and standing BP dropped to 88/62. She did not report any symptoms. Considering significant reality and blood pressure and patient's presentation today, recommend admission for additional IV hydration. Patient declined and would like discharge to home and oral hydration. Had long discussion with patient informed her the risks of leaving without additional medical treatment. She verbalized understanding of risks including recurrent syncope, serious injury, or other life-threatening events. Patient is given an additional liter of IV fluids and discharged to home. She does admit to drinking less water throughout the day due to her urinary incontinence. Discussed that this is likely largely contributing to her recurrent syncope and she will need to increase her oral water intake.   Patient discussed with attending physician Dr. Sabra Heck, he is aware of patient's work-up, diagnosis and patient's decision for discharge.  Final Clinical Impression(s) / ED Diagnoses Final diagnoses:  Syncope and collapse  Orthostatic hypotension  Closed fracture of one rib of left side, initial encounter    Rx / DC Orders ED Discharge Orders         Ordered    Incentive spirometry RT        02/06/21 2210           Kasheena Sambrano, [place] N, PA-C 02/06/21 2223    Noemi Chapel, MD 02/08/21 2315

## 2021-02-06 NOTE — ED Notes (Signed)
Orthostatic vital signs done with this RN and [place], [place], at pt bedside

## 2021-02-09 DIAGNOSIS — R55 Syncope and collapse: Secondary | ICD-10-CM | POA: Diagnosis not present

## 2021-02-09 DIAGNOSIS — N1831 Chronic kidney disease, stage 3a: Secondary | ICD-10-CM | POA: Diagnosis not present

## 2021-02-18 ENCOUNTER — Ambulatory Visit: Payer: Medicare PPO | Admitting: Internal Medicine

## 2021-02-18 ENCOUNTER — Other Ambulatory Visit: Payer: Self-pay

## 2021-02-18 ENCOUNTER — Encounter: Payer: Self-pay | Admitting: Internal Medicine

## 2021-02-18 VITALS — BP 144/82 | HR 79 | Ht 67.0 in | Wt 153.2 lb

## 2021-02-18 DIAGNOSIS — Z8249 Family history of ischemic heart disease and other diseases of the circulatory system: Secondary | ICD-10-CM | POA: Diagnosis not present

## 2021-02-18 DIAGNOSIS — I951 Orthostatic hypotension: Secondary | ICD-10-CM | POA: Insufficient documentation

## 2021-02-18 DIAGNOSIS — G20 Parkinson's disease: Secondary | ICD-10-CM | POA: Diagnosis not present

## 2021-02-18 MED ORDER — DROXIDOPA 100 MG PO CAPS: 100.0000 mg | ORAL_CAPSULE | Freq: Three times a day (TID) | ORAL | 3 refills | Status: DC

## 2021-02-18 NOTE — Progress Notes (Addendum)
Cardiology Office Note:    Date:  02/18/2021   ID:  Desiree Allen, DOB 28-Sep-1946, MRN [id number]  PCP:  Hulan Fess, MD   [place]  Cardiologist:  No primary care provider on file.  Advanced Practice Provider:  No care team member to display Electrophysiologist:  None       CC: Orthostatic Hypotension in the setting of Parkinson's Disease Consulted for the evaluation of OH at the behest of Hulan Fess, MD  History of Present Illness:    Desiree Allen is a 75 y.o. female with a hx of Orthostatic Hypotension and Parkinson's Disease who presents for evaluation 02/18/21.  Patient notes that she is feeling concern for passing out..    Patient notes that she has had two episodes of syncope 02/03/21; 02/06/21.  Did not had any warning.  Took a step and felt weak that she was going to pass out.  Passed out, hit the floor and had broke a rib.  Does not remember anything from 02/06/21 pass out:  Husband notes that she took three steps and passed out.  In the past she would pass out as a kid:  In a chair or standing, and standing in the church choir.  During a panic attack this happened and during her period but this improved on anti-depressants  One in pre-op for foot surgery.  Through all of this she had an aura precipitating. It.    Has had no chest pain, chest pressure, chest tightness, chest stinging.  No shortness of breath, DOE.  No PND or orthopnea.  No bendopnea.  No palpitations.  Patient has been diagnosed with Parkinson's Disease.  Had tremors in her left hand for 5-6 years.  Formerly diagnosed in January 2021.   Past Medical History:  Diagnosis Date   Depression with anxiety    GERD (gastroesophageal reflux disease)    Hyperplastic colon polyp    Melanoma ([place]) 2012   Mixed dyslipidemia    Tremor    Venous insufficiency of both lower extremities    Vitamin D deficiency     Past Surgical History:  Procedure Laterality Date    BUNIONECTOMY Left    CATARACT EXTRACTION Bilateral    MELANOMA EXCISION  2012    Current Medications: Current Meds  Medication Sig   atorvastatin (LIPITOR) 20 MG tablet Take 20 mg by mouth daily.    carbidopa-levodopa (SINEMET IR) 25-100 MG tablet Take 1 tablet by mouth 3 (three) times daily.   Cholecalciferol (VITAMIN D) 2000 units CAPS Take by mouth daily.   Droxidopa 100 MG CAPS Take 1 capsule (100 mg total) by mouth in the morning, at noon, and at bedtime.   Multiple Vitamins-Minerals (WOMENS MULTIVITAMIN PO) Take by mouth daily.   Omega-3 Fatty Acids (FISH OIL) 1200 MG CAPS Take 1,200 mg by mouth daily.   PARoxetine (PAXIL) 40 MG tablet Take 40 mg by mouth daily.      Allergies:   Penicillins   Social History   Socioeconomic History   Marital status: Married    Spouse name: Not on file   Number of children: 3   Years of education: Bachelors   Highest education level: Not on file  Occupational History   Occupation: Retired  Tobacco Use   Smoking status: Never Smoker   Smokeless tobacco: Never Used  Scientific laboratory technician Use: Never used  Substance and Sexual Activity   Alcohol use: No   Drug use: No  Sexual activity: Not on file  Other Topics Concern   Not on file  Social History Narrative   Lives at home with husband.   Left-handed.   No caffeine use.   Social Determinants of Health   Financial Resource Strain: Not on file  Food Insecurity: Not on file  Transportation Needs: Not on file  Physical Activity: Not on file  Stress: Not on file  Social Connections: Not on file     Family History: The patient's family history includes Anemia in her father; Multiple myeloma in her mother. History of coronary artery disease notable for sister. History of heart failure notable for sister (needed valve replacement). History of arrhythmia notable for palpitations in her father. History of aortic aneurysm (sister) and dissection (mother).  ROS:    Please see the history of present illness.     All other systems reviewed and are negative.  EKGs/Labs/Other Studies Reviewed:    The following studies were reviewed today:  EKG:   02/08/21: SR 85 RAE, does not meet septal infarct criteria  Recent Labs: 02/03/2021: TSH 2.939 02/06/2021: BUN 22; Creatinine, Ser 1.18; Hemoglobin 14.0; Platelets 223; Potassium 4.1; Sodium 140  Recent Lipid Panel No results found for: CHOL, TRIG, HDL, CHOLHDL, VLDL, LDLCALC, LDLDIRECT   Risk Assessment/Calculations:     N/A  Physical Exam:    VS:  BP (!) 144/82    Pulse 79    Ht _0  (1.702 m)    Wt 153 lb 3.2 oz (69.5 kg)    SpO2 99%    BMI 23.99 kg/m     Orthostatic Vitals: Supine:  BP 166/88  HR 81 Sitting:  BP 144/82  HR 79 Standing:  BP 112/68  HR 82 Prolonged Stand:  BP 118/70  HR 85   Wt Readings from Last 3 Encounters:  02/18/21 153 lb 3.2 oz (69.5 kg)  01/05/21 155 lb (70.3 kg)  07/02/20 161 lb (73 kg)     GEN:  Well nourished, well developed in no acute distress HEENT: Normal NECK: No JVD; No carotid bruits LYMPHATICS: No lymphadenopathy CARDIAC: RRR, no murmurs, rubs, gallops RESPIRATORY:  Clear to auscultation without rales, wheezing or rhonchi  ABDOMEN: Soft, non-tender, non-distended MUSCULOSKELETAL:  No edema; No deformity  SKIN: Warm and dry NEUROLOGIC:  Alert and oriented x 3 PSYCHIATRIC:  Normal affect   ASSESSMENT:    1. Orthostatic hypotension   2. Family history of aortic aneurysm   3. Parkinson's disease ([place])    PLAN:    In order of problems listed above:  Orthostatic Hypotension Parkinson's Disease -symptomatic - stress the importance of salt and water intake - on no TCAs, alpha agonists, beta antagonists suitable for discontinuation  - gave education on slow rise, Valsalva maneuver exacerbation, temperature change - discussed muscle contraction and leg crossing for concern of supine hypertension - discussed elevation to 30-45 degrees when  sleeping -  exercise deconditioning exacerbates this issue, optimally benefits from bike/row or swimming - offered compression stockings and abdominal binders - caffeine for post prandial symptoms with hydration - droxidopa (100 mg TID) could be reasonable:  We have sent this medication to her pharmacy but will discuss with her neurologist prior to having the patient takes this medication - heating abdominal pad for supine hypertension  Family history of aortic dissection and aneurysm - will get screening echocardiogram  Four months  follow up unless new symptoms or abnormal test results warranting change in plan  Would be reasonable for  Video Visit  Follow up  Would be reasonable for  APP Follow up  ADDENDUM: Patient and family had additional questions:  Droxidopa's biggest risk factor is stroke and associated supine hypertension (already present) that could be worse with this medication).  For that reason, it is important to check blood pressure before and during treatment; and to take last dose of droxidopa 3 hours before bedtime.  Discussed elevating elevating head of the bed at least 30 degrees.  Discussed limited data behind the drug for long term improvement and concurrent monitoring with neurology.  Patient to continue to do exercise.  Discussed with patient what a burpee is (with an example).  Patient and husband had no further questions.     Medication Adjustments/Labs and Tests Ordered: Current medicines are reviewed at length with the patient today.  Concerns regarding medicines are outlined above.  Orders Placed This Encounter  Procedures   ECHOCARDIOGRAM COMPLETE   Meds ordered this encounter  Medications   Droxidopa 100 MG CAPS    Sig: Take 1 capsule (100 mg total) by mouth in the morning, at noon, and at bedtime.    Dispense:  270 capsule    Refill:  3    Patient Instructions  Medication Instructions:  Your physician has recommended you make the following change  in your medication:   START: droxidopa 187m by mouth three times daily - - - Do not take until Dr. CGasper Sellsnotifies you *If you need a refill on your cardiac medications before your next appointment, please call your pharmacy*   Lab Work: NONE If you have labs (blood work) drawn today and your tests are completely normal, you will receive your results only by:  [place](if you have MyChart) OR  A paper copy in the mail If you have any lab test that is abnormal or we need to change your treatment, we will call you to review the results.   Testing/Procedures: Your physician has requested that you have an echocardiogram. Echocardiography is a painless test that uses sound waves to create images of your heart. It provides your doctor with information about the size and shape of your heart and how well your hearts chambers and valves are working. This procedure takes approximately one hour. There are no restrictions for this procedure.     Follow-Up: At [facility] you and your health needs are our priority.  As part of our continuing mission to provide you with exceptional heart care, we have created designated Provider Care Teams.  These Care Teams include your primary Cardiologist (physician) and Advanced Practice Providers (APPs -  Physician Assistants and Nurse Practitioners) who all work together to provide you with the care you need, when you need it.  We recommend signing up for the patient portal called "MyChart".  Sign up information is provided on this After Visit Summary.  MyChart is used to connect with patients for Virtual Visits (Telemedicine).  Patients are able to view lab/test results, encounter notes, upcoming appointments, etc.  Non-urgent messages can be sent to your provider as well.   To learn more about what you can do with MyChart, go to [email or web address]    Your next appointment:   3-4 month(s)  The format for your next appointment:   In  Person  Provider:   You may see CGasper Sells MD or one of the following Advanced Practice Providers on your designated Care Team:    DMelina Copa PA-C  MErmalinda Barrios PA-C  Signed, Werner Lean, MD  02/18/2021 11:23 AM    [place]

## 2021-02-18 NOTE — Patient Instructions (Addendum)
Medication Instructions:  Your physician has recommended you make the following change in your medication:   START: droxidopa 100mg  by mouth three times daily - - - Do not take until Dr. Gasper Sells notifies you *If you need a refill on your cardiac medications before your next appointment, please call your pharmacy*   Lab Work: NONE If you have labs (blood work) drawn today and your tests are completely normal, you will receive your results only by: Marland Kitchen MyChart Message (if you have MyChart) OR . A paper copy in the mail If you have any lab test that is abnormal or we need to change your treatment, we will call you to review the results.   Testing/Procedures: Your physician has requested that you have an echocardiogram. Echocardiography is a painless test that uses sound waves to create images of your heart. It provides your doctor with information about the size and shape of your heart and how well your heart's chambers and valves are working. This procedure takes approximately one hour. There are no restrictions for this procedure.     Follow-Up: At [facility], you and your health needs are our priority.  As part of our continuing mission to provide you with exceptional heart care, we have created designated Provider Care Teams.  These Care Teams include your primary Cardiologist (physician) and Advanced Practice Providers (APPs -  Physician Assistants and Nurse Practitioners) who all work together to provide you with the care you need, when you need it.  We recommend signing up for the patient portal called "MyChart".  Sign up information is provided on this After Visit Summary.  MyChart is used to connect with patients for Virtual Visits (Telemedicine).  Patients are able to view lab/test results, encounter notes, upcoming appointments, etc.  Non-urgent messages can be sent to your provider as well.   To learn more about what you can do with MyChart, go to [email or web address].     Your next appointment:   3-4 month(s)  The format for your next appointment:   In Person  Provider:   You may see Gasper Sells, MD or one of the following Advanced Practice Providers on your designated Care Team:    Melina Copa, PA-C  Ermalinda Barrios, PA-C

## 2021-02-19 ENCOUNTER — Telehealth: Payer: Self-pay

## 2021-02-19 MED ORDER — DROXIDOPA 100 MG PO CAPS: 100.0000 mg | ORAL_CAPSULE | Freq: Three times a day (TID) | ORAL | 11 refills | Status: DC

## 2021-02-19 NOTE — Telephone Encounter (Signed)
Droxidopa PA started through [employer]. Key: [id number]

## 2021-02-19 NOTE — Telephone Encounter (Signed)
Message from covermymeds: Runell Gess Key: Baldemar Friday - PA Case ID: [id number] - Rx #: [id number] Need help? Call us at [phone] Outcome: Denied today [employer] has approved a recurring 30 day supply for the drug listed above but denied your request for a 90 day supply. The drug you requested is a specialty drug. Your Prescription Drug Guide says that specialty drugs are limited to a 30-day supply. You can view your PDG [employer] has approved coverage for the drug listed above, up to a 30-day supply per fill, under your Part D benefit for/through 3 months. Drug: Droxidopa 100MG  capsules Form: [employer] Electronic PA Form  I have changed Droxidopa 100 mg to a 30 day supply #90 with 11 refills and e-scribed to [employer] with note to pharmacy explaining change in quantity.

## 2021-02-22 ENCOUNTER — Encounter: Payer: Self-pay | Admitting: *Deleted

## 2021-02-22 ENCOUNTER — Telehealth: Payer: Self-pay | Admitting: Internal Medicine

## 2021-02-22 MED ORDER — FLUDROCORTISONE ACETATE 0.1 MG PO TABS: 0.1000 mg | ORAL_TABLET | Freq: Every day | ORAL | 3 refills | Status: DC

## 2021-02-22 NOTE — Telephone Encounter (Signed)
Husband of patient called. The Husband said that he spoke with his insurance company and if Dr. Gasper Sells would provide documentation that an adjustable bed is medically necessary for the patient, her insurance would help to cover the cost.   Husband just needs documentation to send to [employer]. Please call Husband to follow up

## 2021-02-22 NOTE — Telephone Encounter (Signed)
Left message to call back  

## 2021-02-22 NOTE — Telephone Encounter (Signed)
Patient's husband is returning call. 

## 2021-02-22 NOTE — Telephone Encounter (Signed)
I can try to help.  It would be something to the effect of:   To whom it may concern:  Ms.  Brinton had been under the care of Dr. Hulan Fess, Kennieth Francois, and myself for a constellation of problems including neurogenic orthostatic hypotension secondary to Parkinson's Disease.  Given her risks of supine hypertension and syncope secondary to the above, a hospital bed would be appropriate; and prevention of supine hypertension and/or a fall may save her from a fall and would be in the best interests of all parties.  We ask for your assistance in this matter.  Rudean Haskell MD   While we are at it, can we stop her Droxidopa (I told them not to take it until hearing back from neurology and I haven't heard back yet) and we can start florinef 0.1 mg Daily instead?   Thanks,  [employer]

## 2021-02-22 NOTE — Addendum Note (Signed)
Addended by: Loren Racer on: 02/22/2021 05:22 PM   Modules accepted: Orders

## 2021-02-22 NOTE — Telephone Encounter (Signed)
Spoke with husband and reviewed recommendations.  He is agreeable to medication change.  He is going to call [facility] and get the fax number for [place] to send the letter to and call back tomorrow.

## 2021-02-22 NOTE — Telephone Encounter (Signed)
Are you willing to write a letter for the insurance company or should she try to get this from PCP?

## 2021-02-23 NOTE — Telephone Encounter (Signed)
Patient's husband calling back with the fax number for [employer]'s clinical intake team. Fax: [phone]

## 2021-02-23 NOTE — Telephone Encounter (Signed)
Spoke with husband and let him know that fax has been sent. He was appreciative for assistance.

## 2021-02-24 ENCOUNTER — Telehealth: Payer: Self-pay | Admitting: Neurology

## 2021-02-24 NOTE — Telephone Encounter (Signed)
The patient is currently taking Sinemet 25-100mg , one tab TID. Her cardiologist has prescribed Florinef 0.1mg , one tab QD. There should not be a contraindication with these medications but she would like for [place] to check with MD or NP.  Per vo by Dr. Krista Blue, it is okay to take both medications.  The patient verbalized understanding of this information.

## 2021-02-24 NOTE — Telephone Encounter (Signed)
Pt wanting to know if the fludrocortisone (FLORINEF) 0.1 MG tablet will conflict with her Parkinson's medication

## 2021-02-26 NOTE — Telephone Encounter (Signed)
Patient's husband is following up. He provided a better fax number for [employer]'s Clinical Intake Team. He states the fax number he initially provided is not working.  Alternate Fax #: [phone]

## 2021-02-26 NOTE — Telephone Encounter (Signed)
Sent another fax to number provided.

## 2021-03-03 DIAGNOSIS — L821 Other seborrheic keratosis: Secondary | ICD-10-CM | POA: Diagnosis not present

## 2021-03-03 DIAGNOSIS — Z85820 Personal history of malignant melanoma of skin: Secondary | ICD-10-CM | POA: Diagnosis not present

## 2021-03-03 DIAGNOSIS — L570 Actinic keratosis: Secondary | ICD-10-CM | POA: Diagnosis not present

## 2021-03-03 DIAGNOSIS — D225 Melanocytic nevi of trunk: Secondary | ICD-10-CM | POA: Diagnosis not present

## 2021-03-03 DIAGNOSIS — Z85828 Personal history of other malignant neoplasm of skin: Secondary | ICD-10-CM | POA: Diagnosis not present

## 2021-03-08 ENCOUNTER — Telehealth: Payer: Self-pay | Admitting: Neurology

## 2021-03-08 NOTE — Telephone Encounter (Signed)
I do not see that we manage her anxiety/depression, I think would be best if she kindly contacts her primary care provider to discuss.

## 2021-03-08 NOTE — Telephone Encounter (Signed)
I called pt and relayed that for her anxiety/depression which could definitely cause  her shaking that is would be more beneficial for her to contact pcp.  She states that her pcp reitred, but I stated that all providers, PA's, NP's could assist her in the this as they have treated her or refilling her meds for this.  If not she may need to see psychiatry to help her.  She verbalized understanding.  If her shaking continues after getting some assistance with anxiety to reach back to [place].  She had called about this previously and we increased to sinemet 1.5tab tid which did not help, she went back to taking 1 daily.

## 2021-03-08 NOTE — Telephone Encounter (Signed)
Pt called and LVM stating that she is needing to speak to the provider or RN regarding her medications. No other information given. No phone number was given either.

## 2021-03-08 NOTE — Telephone Encounter (Signed)
I called pt and she states that last visit she did mention that she was having some times of depression, no appetite, shaking all day long.  She is on paxil, gets from pcp, as no psychiatrist at this time.  She has not contacted them.  Her pcp has retired and PA/NP's are who she see's.  She mentions spoken of xanax at last visit.  I did not see this.  I told her will see SS/NP recommendation.  I did relayed last time we spoke in January that we mentioned seeing pcp or psych for anxiety/depression.  She is taking sinemetn 25/100 po tid.

## 2021-03-18 ENCOUNTER — Other Ambulatory Visit: Payer: Self-pay

## 2021-03-18 ENCOUNTER — Ambulatory Visit (HOSPITAL_COMMUNITY): Payer: Medicare PPO | Attending: Cardiovascular Disease

## 2021-03-18 DIAGNOSIS — R55 Syncope and collapse: Secondary | ICD-10-CM | POA: Insufficient documentation

## 2021-03-18 DIAGNOSIS — E785 Hyperlipidemia, unspecified: Secondary | ICD-10-CM | POA: Insufficient documentation

## 2021-03-18 DIAGNOSIS — I951 Orthostatic hypotension: Secondary | ICD-10-CM | POA: Diagnosis not present

## 2021-03-18 DIAGNOSIS — R609 Edema, unspecified: Secondary | ICD-10-CM | POA: Diagnosis not present

## 2021-03-18 DIAGNOSIS — Z8249 Family history of ischemic heart disease and other diseases of the circulatory system: Secondary | ICD-10-CM | POA: Diagnosis not present

## 2021-03-18 DIAGNOSIS — Z136 Encounter for screening for cardiovascular disorders: Secondary | ICD-10-CM | POA: Insufficient documentation

## 2021-03-18 LAB — ECHOCARDIOGRAM COMPLETE
Area-P 1/2: 3.08 cm2
S' Lateral: 2.3 cm

## 2021-03-29 NOTE — Telephone Encounter (Signed)
Received the message in triage.... I faxed the letter from Dr. Gasper Sells re: the request for a hospital bed, her demographics,and the last OV note to [facility].   Unsure if they are also needing a DME order... will wait to see after they receive the fax sent today.   Spoke with Jinny Blossom with [employer] and canceled the first request per [employer]'s recommendation.  Left message for the pt per her request.

## 2021-03-29 NOTE — Telephone Encounter (Signed)
[employer] called and stated that the hosp bed has been approved but [facility] does not provide hosp beds.  They are needing sent to [facility] Phone number [phone].   fax number [phone].  [place] intake, ( need office notes, Demographics  and order)  Also need to cancel the order to [place] so it does not look like it is 2 orders ( call [phone] to cancel auth# [id number])    Pt would like a call to confirm [phone]

## 2021-03-30 ENCOUNTER — Telehealth: Payer: Self-pay | Admitting: Internal Medicine

## 2021-03-30 NOTE — Telephone Encounter (Signed)
Patient was told to call the office. Did not see where patient was called.. Please call back

## 2021-03-30 NOTE — Telephone Encounter (Signed)
Called patient back in regards to message requesting for her to call the office.  She reports that her DME equipment is set up.  As far as she knows everything is in place and she doesn't need anything further.  I told her to call the office if she needs anything.

## 2021-05-07 ENCOUNTER — Telehealth: Payer: Self-pay | Admitting: Neurology

## 2021-05-07 NOTE — Telephone Encounter (Signed)
Pt called stating that the carbidopa-levodopa (SINEMET IR) 25-100 MG tablet upsets her stomach and she is wanting to know what she can take for it. Please advise.

## 2021-05-10 NOTE — Telephone Encounter (Signed)
LMVM for pt that returning her call.

## 2021-05-11 NOTE — Telephone Encounter (Signed)
LMVM for pt that returned call (2nd) if still need [place] to call back.

## 2021-05-12 NOTE — Telephone Encounter (Signed)
Pt returned phone call. Would like a call back. 

## 2021-05-12 NOTE — Telephone Encounter (Addendum)
For nausea, can try taking Sinemet with food, but works best when taken 30-60 minutes before or after meals. Constipation is common with PD, agree, fluids, fiber intake, bulk forming laxative is recommended up to date if necessary. If started in March, was already established on Sinemet, sounds like Florinef was started same time, but needs to be careful with laxative with this issue. Can readdress at next appointment. Has felt the Sinemet has been helpful.

## 2021-05-12 NOTE — Telephone Encounter (Signed)
I called pt and she is having issues off and on for issues of nausea, constipation since she said taking the sinemet (from 02-2020). She has never mentioned this.  I relayed if not having BM for 8 days, she may need to see pcp, she said she does not have one now or a psychiatrist.  I recommended her to call insurance and see who is in network for both and see about getting scheduled.  I relayed hydration, fiber, prune juice may help.  She was not sure why she was taking sinemet (what it was for)// I relayed we see her for tremors.  Has appt in 06/2021.  Any reccs??

## 2021-05-13 NOTE — Telephone Encounter (Signed)
Called patient back.  Relayed [employer].  She verbalized understanding.  She will touch base with pharmacist about the bulk forming laxative and will try something and if continued problems she will let us know.  She appreciated call back.

## 2021-05-26 DIAGNOSIS — N3944 Nocturnal enuresis: Secondary | ICD-10-CM | POA: Diagnosis not present

## 2021-05-26 DIAGNOSIS — Z7952 Long term (current) use of systemic steroids: Secondary | ICD-10-CM | POA: Diagnosis not present

## 2021-05-26 DIAGNOSIS — K5900 Constipation, unspecified: Secondary | ICD-10-CM | POA: Diagnosis not present

## 2021-05-26 DIAGNOSIS — R030 Elevated blood-pressure reading, without diagnosis of hypertension: Secondary | ICD-10-CM | POA: Diagnosis not present

## 2021-05-26 DIAGNOSIS — Z803 Family history of malignant neoplasm of breast: Secondary | ICD-10-CM | POA: Diagnosis not present

## 2021-05-26 DIAGNOSIS — F329 Major depressive disorder, single episode, unspecified: Secondary | ICD-10-CM | POA: Diagnosis not present

## 2021-05-26 DIAGNOSIS — I951 Orthostatic hypotension: Secondary | ICD-10-CM | POA: Diagnosis not present

## 2021-05-26 DIAGNOSIS — G20 Parkinson's disease: Secondary | ICD-10-CM | POA: Diagnosis not present

## 2021-05-26 DIAGNOSIS — E785 Hyperlipidemia, unspecified: Secondary | ICD-10-CM | POA: Diagnosis not present

## 2021-05-28 ENCOUNTER — Encounter: Payer: Self-pay | Admitting: Internal Medicine

## 2021-05-28 ENCOUNTER — Other Ambulatory Visit: Payer: Self-pay

## 2021-05-28 ENCOUNTER — Ambulatory Visit: Payer: Medicare PPO | Admitting: Internal Medicine

## 2021-05-28 VITALS — BP 126/74 | HR 76 | Ht 67.0 in | Wt 141.0 lb

## 2021-05-28 DIAGNOSIS — G903 Multi-system degeneration of the autonomic nervous system: Secondary | ICD-10-CM | POA: Diagnosis not present

## 2021-05-28 DIAGNOSIS — Z8249 Family history of ischemic heart disease and other diseases of the circulatory system: Secondary | ICD-10-CM | POA: Diagnosis not present

## 2021-05-28 DIAGNOSIS — E7849 Other hyperlipidemia: Secondary | ICD-10-CM | POA: Diagnosis not present

## 2021-05-28 NOTE — Patient Instructions (Signed)
Medication Instructions:  Your physician recommends that you continue on your current medications as directed. Please refer to the Current Medication list given to you today.  *If you need a refill on your cardiac medications before your next appointment, please call your pharmacy*   Lab Work: NONE If you have labs (blood work) drawn today and your tests are completely normal, you will receive your results only by: Marland Kitchen MyChart Message (if you have MyChart) OR . A paper copy in the mail If you have any lab test that is abnormal or we need to change your treatment, we will call you to review the results.   Testing/Procedures: NONE   Follow-Up: At [facility], you and your health needs are our priority.  As part of our continuing mission to provide you with exceptional heart care, we have created designated Provider Care Teams.  These Care Teams include your primary Cardiologist (physician) and Advanced Practice Providers (APPs -  Physician Assistants and Nurse Practitioners) who all work together to provide you with the care you need, when you need it.  We recommend signing up for the patient portal called "MyChart".  Sign up information is provided on this After Visit Summary.  MyChart is used to connect with patients for Virtual Visits (Telemedicine).  Patients are able to view lab/test results, encounter notes, upcoming appointments, etc.  Non-urgent messages can be sent to your provider as well.   To learn more about what you can do with MyChart, go to [email or web address].    Your next appointment:   12 month(s)  The format for your next appointment:   In Person  Provider:   You may see Rudean Haskell, MD or one of the following Advanced Practice Providers on your designated Care Team:    Melina Copa, PA-C  Ermalinda Barrios, PA-C

## 2021-05-28 NOTE — Progress Notes (Signed)
Cardiology Office Note:    Date:  05/28/2021   ID:  Desiree Allen, DOB [birth year], MRN [id number]  PCP:  [facility] @ [place]  Cardiologist:  Werner Lean, MD  Advanced Practice Provider:  No care team member to display Electrophysiologist:  None      CC: Orthostatic Hypotension in the setting of Parkinson's Disease Follow up.  History of Present Illness:    Desiree Allen is a 75 y.o. female with a hx of Orthostatic Hypotension and Parkinson's Disease who presents for evaluation 02/18/21. In interim of this visit, patient was unable to get droxidopa and was started on florinef.  Patient was unable to reach PCP about hospital bed and we assisted with this one time (patient deferred in the setting of a used hospital bed).  Patient notes that she is doing OK.  Since last visit notes no changes.  Relevant interval testing or therapy include florinef.  No changes of symptoms but has no no syncope or collapse since. There are no interval hospital/ED visit.    No chest pain or pressure .  No SOB/DOE and no PND/Orthopnea.  No weight gain or leg swelling.  No palpitations or syncope.  Asymptomatic from orthostatic standing pressure.  No supine headache   Past Medical History:  Diagnosis Date  . Depression with anxiety   . GERD (gastroesophageal reflux disease)   . Hyperplastic colon polyp   . Melanoma ([place]) 2012  . Mixed dyslipidemia   . Tremor   . Venous insufficiency of both lower extremities   . Vitamin D deficiency     Past Surgical History:  Procedure Laterality Date  . BUNIONECTOMY Left   . CATARACT EXTRACTION Bilateral   . MELANOMA EXCISION  2012    Current Medications: Current Meds  Medication Sig  . atorvastatin (LIPITOR) 20 MG tablet Take 20 mg by mouth daily.   . carbidopa-levodopa (SINEMET IR) 25-100 MG tablet Take 1 tablet by mouth 3 (three) times daily.  . Cholecalciferol (VITAMIN D) 2000 units CAPS  Take by mouth daily.  . fludrocortisone (FLORINEF) 0.1 MG tablet Take 1 tablet (0.1 mg total) by mouth daily.  . Multiple Vitamins-Minerals (WOMENS MULTIVITAMIN PO) Take by mouth daily.  . Omega-3 Fatty Acids (FISH OIL) 1200 MG CAPS Take 1,200 mg by mouth daily.  Marland Kitchen PARoxetine (PAXIL) 40 MG tablet Take 40 mg by mouth daily.      Allergies:   Penicillins   Social History   Socioeconomic History  . Marital status: Married    Spouse name: Not on file  . Number of children: 3  . Years of education: Bachelors  . Highest education level: Not on file  Occupational History  . Occupation: Retired  Tobacco Use  . Smoking status: Never Smoker  . Smokeless tobacco: Never Used  Vaping Use  . Vaping Use: Never used  Substance and Sexual Activity  . Alcohol use: No  . Drug use: No  . Sexual activity: Not on file  Other Topics Concern  . Not on file  Social History Narrative   Lives at home with husband.   Left-handed.   No caffeine use.   Social Determinants of Health   Financial Resource Strain: Not on file  Food Insecurity: Not on file  Transportation Needs: Not on file  Physical Activity: Not on file  Stress: Not on file  Social Connections: Not on file     Family History: The patient's  family history includes Anemia in her father; Multiple myeloma in her mother. History of coronary artery disease notable for sister. History of heart failure notable for sister (needed valve replacement). History of arrhythmia notable for palpitations in her father. History of aortic aneurysm (sister) and dissection (mother).  ROS:   Please see the history of present illness.     All other systems reviewed and are negative.  EKGs/Labs/Other Studies Reviewed:    The following studies were reviewed today:  EKG:   02/08/21: SR 94 RAE, does not meet septal infarct criteria  Transthoracic Echocardiogram: Date: 03/18/21 Results: 1. Left ventricular ejection fraction, by estimation, is 60  to 65%. The  left ventricle has normal function. The left ventricle has no regional  wall motion abnormalities. Left ventricular diastolic parameters are  consistent with Grade I diastolic  dysfunction (impaired relaxation).  2. Right ventricular systolic function is normal. The right ventricular  size is normal.  3. The mitral valve is normal in structure. No evidence of mitral valve  regurgitation. No evidence of mitral stenosis.  4. The aortic valve is normal in structure. Aortic valve regurgitation is  not visualized. No aortic stenosis is present.  5. The inferior vena cava is normal in size with greater than 50%  respiratory variability, suggesting right atrial pressure of 3 mmHg.    Recent Labs: 02/03/2021: TSH 2.939 02/06/2021: BUN 22; Creatinine, Ser 1.18; Hemoglobin 14.0; Platelets 223; Potassium 4.1; Sodium 140  Recent Lipid Panel No results found for: CHOL, TRIG, HDL, CHOLHDL, VLDL, LDLCALC, LDLDIRECT   Risk Assessment/Calculations:     N/A  Physical Exam:    VS:  BP 126/74   Pulse 76   Ht '5\' 7"'  (1.702 m)   Wt 64 kg   SpO2 98%   BMI 22.08 kg/m     Orthostatic Vitals: Supine:  BP 130/77  HR 73 Sitting:  BP 135/77  HR 73 Standing:  BP 94/59  HR 76 (asymptomatic) Prolonged Stand:  BP 117/72  HR 81   Wt Readings from Last 3 Encounters:  05/28/21 64 kg  02/18/21 69.5 kg  01/05/21 70.3 kg    GEN:  Well nourished, well developed in no acute distress HEENT: Normal NECK: No JVD LYMPHATICS: No lymphadenopathy CARDIAC: RRR, no murmurs, rubs, gallops RESPIRATORY:  Clear to auscultation without rales, wheezing or rhonchi  ABDOMEN: Soft, non-tender, non-distended MUSCULOSKELETAL:  No edema; No deformity  SKIN: Warm and dry NEUROLOGIC:  Alert and oriented x 3 PSYCHIATRIC:  Slightly depressed affect   ASSESSMENT:    1. Parkinson's disease with neurogenic orthostatic hypotension ([place])   2. Family history of aortic aneurysm   3. Other hyperlipidemia     PLAN:    In order of problems listed above:  Parkinson's Disease with neurogenic orthostatic hypotension Family history of aortic dissection and aneurysm HLD - asymptomatic - stressed the importance of salt and water intake - re-education on slow rise, Valsalva maneuver exacerbation, temperature change - discussed elevation to 30-45 degrees when sleeping - Discussed florinef 0.1 Mg PO Daily (symptoms have resolved) - heating abdominal pad for supine hypertension - on atorvastatin 20 mg PO with LDL near goal (103) - Goes to physical in September will defer BMP until then per PT  One year follow up unless new symptoms or abnormal test results warranting change in plan  Would be reasonable for  APP Follow up   Medication Adjustments/Labs and Tests Ordered: Current medicines are reviewed at length with the patient today.  Concerns regarding  medicines are outlined above.  No orders of the defined types were placed in this encounter.  No orders of the defined types were placed in this encounter.   Patient Instructions  Medication Instructions:  Your physician recommends that you continue on your current medications as directed. Please refer to the Current Medication list given to you today.  *If you need a refill on your cardiac medications before your next appointment, please call your pharmacy*   Lab Work: NONE If you have labs (blood work) drawn today and your tests are completely normal, you will receive your results only by: Marland Kitchen MyChart Message (if you have MyChart) OR . A paper copy in the mail If you have any lab test that is abnormal or we need to change your treatment, we will call you to review the results.   Testing/Procedures: NONE   Follow-Up: At [facility], you and your health needs are our priority.  As part of our continuing mission to provide you with exceptional heart care, we have created designated Provider Care Teams.  These Care Teams include your  primary Cardiologist (physician) and Advanced Practice Providers (APPs -  Physician Assistants and Nurse Practitioners) who all work together to provide you with the care you need, when you need it.  We recommend signing up for the patient portal called "MyChart".  Sign up information is provided on this After Visit Summary.  MyChart is used to connect with patients for Virtual Visits (Telemedicine).  Patients are able to view lab/test results, encounter notes, upcoming appointments, etc.  Non-urgent messages can be sent to your provider as well.   To learn more about what you can do with MyChart, go to [email or web address].    Your next appointment:   12 month(s)  The format for your next appointment:   In Person  Provider:   You may see Rudean Haskell, MD or one of the following Advanced Practice Providers on your designated Care Team:    Melina Copa, PA-C  Ermalinda Barrios, PA-C          Signed, Werner Lean, MD  05/28/2021 10:59 AM    [place]

## 2021-06-02 ENCOUNTER — Encounter: Payer: Self-pay | Admitting: Behavioral Health

## 2021-06-02 ENCOUNTER — Other Ambulatory Visit: Payer: Self-pay

## 2021-06-02 ENCOUNTER — Ambulatory Visit (INDEPENDENT_AMBULATORY_CARE_PROVIDER_SITE_OTHER): Payer: Medicare PPO | Admitting: Behavioral Health

## 2021-06-02 VITALS — BP 148/82 | HR 83 | Ht 67.0 in | Wt 143.0 lb

## 2021-06-02 DIAGNOSIS — F330 Major depressive disorder, recurrent, mild: Secondary | ICD-10-CM | POA: Diagnosis not present

## 2021-06-02 DIAGNOSIS — F411 Generalized anxiety disorder: Secondary | ICD-10-CM

## 2021-06-02 MED ORDER — PAROXETINE HCL 10 MG PO TABS
10.0000 mg | ORAL_TABLET | Freq: Every day | ORAL | 0 refills | Status: DC
Start: 2021-06-02 — End: 2021-07-06

## 2021-06-02 NOTE — Progress Notes (Signed)
[facility] MD/PA/NP Initial Note  06/02/2021 11:08 PM Desiree Allen  MRN:  [id number]  Chief Complaint:  Chief Complaint    Anxiety; Depression; Establish Care; Medication Problem; Medication Refill      HPI:  75 year old female presents to this office for initial visit and and to establish care. She says that her former doctor retired and she was needing new clinician to manage her antidepressant medication. She says that she has struggled with depression and anxiety most of her life. She says that she to prozac long ago and it worked well for many years. Then at some point the drug did not work as well and her former provider placed her on Paxil. Says that she has been on Paxil 40 mg for several years but that it too does not seem to be working as well. She says that it is not unbearable but that she does notice an increase in breakthrough anxiety and depression. She said that she would like to consider adjustments or alternative to treat her symptoms. She said to complicate matters more, she was diagnosed with Parkinson's disease 1.5 years ago. Her neurologist manages those medications for her. She reports her anxiety today at 6 and depression at 2. She feels like she has more anxiety than depression. She said that she is sleeping 7-8 hours per night. She reports no mania, delusions or delirium. No psychosis. No SI/HI.   Past psychiatric medication trial: Prozac= blunted  Visit Diagnosis:    ICD-10-CM   1. Generalized anxiety disorder  F41.1 PARoxetine (PAXIL) 10 MG tablet  2. Mild episode of recurrent major depressive disorder (HCC)  F33.0     Past Psychiatric History: Hulan Fess PCP, [facility] Dr. Candis Schatz  Past Medical History:  Past Medical History:  Diagnosis Date  . Depression with anxiety   . GERD (gastroesophageal reflux disease)   . Hyperplastic colon polyp   . Melanoma ([place]) 2012  . Mixed dyslipidemia   . Tremor   . Venous insufficiency of both lower  extremities   . Vitamin D deficiency     Past Surgical History:  Procedure Laterality Date  . BUNIONECTOMY Left   . CATARACT EXTRACTION Bilateral   . MELANOMA EXCISION  2012    Family Psychiatric History: unknown  Family History:  Family History  Problem Relation Age of Onset  . Multiple myeloma Mother   . Anemia Father     Social History:  Social History   Socioeconomic History  . Marital status: Married    Spouse name: Not on file  . Number of children: 3  . Years of education: Bachelors  . Highest education level: Not on file  Occupational History  . Occupation: Retired  Tobacco Use  . Smoking status: Never Smoker  . Smokeless tobacco: Never Used  Vaping Use  . Vaping Use: Never used  Substance and Sexual Activity  . Alcohol use: No  . Drug use: No  . Sexual activity: Not on file  Other Topics Concern  . Not on file  Social History Narrative   Lives at home with husband.   Left-handed.   No caffeine use.   Social Determinants of Health   Financial Resource Strain: Not on file  Food Insecurity: Not on file  Transportation Needs: Not on file  Physical Activity: Not on file  Stress: Not on file  Social Connections: Not on file    Allergies:  Allergies  Allergen Reactions  . Penicillins Rash    Metabolic Disorder  Labs: No results found for: HGBA1C, MPG No results found for: PROLACTIN No results found for: CHOL, TRIG, HDL, CHOLHDL, VLDL, LDLCALC Lab Results  Component Value Date   TSH 2.939 02/03/2021   TSH 2.570 07/02/2020    Therapeutic Level Labs: No results found for: LITHIUM No results found for: VALPROATE No components found for:  CBMZ  Current Medications: Current Outpatient Medications  Medication Sig Dispense Refill  . atorvastatin (LIPITOR) 20 MG tablet Take 20 mg by mouth daily.     . carbidopa-levodopa (SINEMET IR) 25-100 MG tablet Take 1 tablet by mouth 3 (three) times daily. 270 tablet 4  . Cholecalciferol (VITAMIN D) 2000  units CAPS Take by mouth daily.    . fludrocortisone (FLORINEF) 0.1 MG tablet Take 1 tablet (0.1 mg total) by mouth daily. 90 tablet 3  . Multiple Vitamins-Minerals (WOMENS MULTIVITAMIN PO) Take by mouth daily.    . Omega-3 Fatty Acids (FISH OIL) 1200 MG CAPS Take 1,200 mg by mouth daily.    Marland Kitchen PARoxetine (PAXIL) 10 MG tablet Take 1 tablet (10 mg total) by mouth daily. 90 tablet 0  . PARoxetine (PAXIL) 40 MG tablet Take 40 mg by mouth daily.      No current facility-administered medications for this visit.    Medication Side Effects: none  Orders placed this visit:  No orders of the defined types were placed in this encounter.   Psychiatric Specialty Exam:  Review of Systems  HENT: Positive for tinnitus.   Cardiovascular: Positive for leg swelling.  Genitourinary: Positive for urgency.  Neurological: Positive for tremors.    Blood pressure (!) 148/82, pulse 83, height _0  (1.702 m), weight 143 lb (64.9 kg).Body mass index is 22.4 kg/m.  General Appearance: Casual, Neat and Well Groomed  Eye Contact:  Good  Speech:  Clear and Coherent  Volume:  Normal  Mood:  NA  Affect:  Appropriate  Thought Process:  Coherent  Orientation:  Full (Time, Place, and Person)  Thought Content: Logical   Suicidal Thoughts:  No  Homicidal Thoughts:  No  Memory:  WNL  Judgement:  Good  Insight:  Good  Psychomotor Activity:  Normal  Concentration:  Concentration: Good  Recall:  Good  Fund of Knowledge: Good  Language: Good  Assets:  Desire for Improvement Resilience  ADL's:  Intact  Cognition: WNL  Prognosis:  Good   Screenings:  Mini-Mental   Flowsheet Row Office Visit from 01/05/2021 in [facility] Office Visit from 03/24/2020 in [facility]  Total Score (max 30 points ) 25 26    [facility] from 02/06/2021 in [place] No Risk      Receiving Psychotherapy: No   Treatment  Plan/Recommendations:  To increase dose of Paxil from 40 mg daily to 50 mg daily. Will report worsening symptoms or side effects promptly Agreed to f/u in 4 weeks to reassess Greater than 50% of 60 min. face to face time with patient was spent on counseling and coordination of care. We discussed in length long history of anxiety and depression. Addressed patients concerns on taking less medications especially since diagnosis of Parkinson's. She was concerned with medication interference or change. Suggested adjunctive therapy of buspar but she was hesitant to add another medication. Explained to her recommended max dosage of Paxil for geriatric patients is 40 mg. However since she has had no previous undesirable side effects and tolerated this medication well, she was willing for  slight increase of 10 mg for total of 50 mg Paxil to see if this would help with some break through anxiety. Explained that up to 60 mg could be justified for adults with anxiety and depression. She understand the slight risk due to age. This was more appealing to her than adding the buspar or another medication at this time.     Elwanda Brooklyn, NP

## 2021-06-03 ENCOUNTER — Telehealth: Payer: Self-pay | Admitting: Behavioral Health

## 2021-06-08 DIAGNOSIS — L738 Other specified follicular disorders: Secondary | ICD-10-CM | POA: Diagnosis not present

## 2021-06-08 DIAGNOSIS — Z85828 Personal history of other malignant neoplasm of skin: Secondary | ICD-10-CM | POA: Diagnosis not present

## 2021-06-08 DIAGNOSIS — L821 Other seborrheic keratosis: Secondary | ICD-10-CM | POA: Diagnosis not present

## 2021-06-08 DIAGNOSIS — L570 Actinic keratosis: Secondary | ICD-10-CM | POA: Diagnosis not present

## 2021-06-08 DIAGNOSIS — Z85820 Personal history of malignant melanoma of skin: Secondary | ICD-10-CM | POA: Diagnosis not present

## 2021-06-14 NOTE — Telephone Encounter (Signed)
Error

## 2021-06-30 ENCOUNTER — Other Ambulatory Visit: Payer: Self-pay

## 2021-06-30 ENCOUNTER — Encounter: Payer: Self-pay | Admitting: Behavioral Health

## 2021-06-30 ENCOUNTER — Ambulatory Visit: Payer: Medicare PPO | Admitting: Behavioral Health

## 2021-06-30 DIAGNOSIS — F330 Major depressive disorder, recurrent, mild: Secondary | ICD-10-CM | POA: Diagnosis not present

## 2021-06-30 DIAGNOSIS — F411 Generalized anxiety disorder: Secondary | ICD-10-CM | POA: Diagnosis not present

## 2021-06-30 MED ORDER — SERTRALINE HCL 50 MG PO TABS: ORAL_TABLET | ORAL | 1 refills | Status: DC

## 2021-06-30 NOTE — Progress Notes (Signed)
[facility] Med Check  Patient ID: Desiree Allen,  MRN: [id number]  PCP: [facility] [place]  Date of Evaluation: 06/30/2021 Time spent:30 minutes  Chief Complaint:  Chief Complaint   Anxiety; Depression; Medication Problem; Patient Education; Follow-up     HISTORY/CURRENT STATUS: HPI 75 year old female patient presents to this office for follow up and medication management. She says, "I'm doing ok". Patients affect is not congruent with presentation. She begins to tearfully say that the medication increase has really not improved her symptoms of anxiety and depression. Says that her husband expects her to just be well and that the medication should fix her. Says that she has struggled with this for many years since she was young. She understands that she is at upward safe limitations for Paxil and advancing age. She says her anxiety today is 6/10 and depression is 3/10. She is much more flat and hypo-verbal this visit than last. She says she is willing to try to switch medications to assist her moods.  She has no mania. No psychosis or delirium. No SI/HI.   Individual Medical History/ Review of Systems: Changes? :No   Allergies: Penicillins  Current Medications:  Current Outpatient Medications:    sertraline (ZOLOFT) 50 MG tablet, Take 1/2 tablet 25 mg daily for 7 days. Then take one whole tablet 50 mg daily., Disp: 30 tablet, Rfl: 1   atorvastatin (LIPITOR) 20 MG tablet, Take 20 mg by mouth daily. , Disp: , Rfl:    carbidopa-levodopa (SINEMET IR) 25-100 MG tablet, Take 1 tablet by mouth 3 (three) times daily., Disp: 270 tablet, Rfl: 4   Cholecalciferol (VITAMIN D) 2000 units CAPS, Take by mouth daily., Disp: , Rfl:    fludrocortisone (FLORINEF) 0.1 MG tablet, Take 1 tablet (0.1 mg total) by mouth daily., Disp: 90 tablet, Rfl: 3   Multiple Vitamins-Minerals (WOMENS MULTIVITAMIN PO), Take by mouth daily., Disp: , Rfl:    Omega-3 Fatty Acids (FISH OIL) 1200 MG  CAPS, Take 1,200 mg by mouth daily., Disp: , Rfl:    PARoxetine (PAXIL) 10 MG tablet, Take 1 tablet (10 mg total) by mouth daily., Disp: 90 tablet, Rfl: 0   PARoxetine (PAXIL) 40 MG tablet, Take 40 mg by mouth daily. , Disp: , Rfl:  Medication Side Effects: none  Family Medical/ Social History: Changes? No  MENTAL HEALTH EXAM:  There were no vitals taken for this visit.There is no height or weight on file to calculate BMI.  General Appearance: Neat  Eye Contact:  Good  Speech:  Clear and Coherent  Volume:  Normal  Mood:  Anxious and Depressed  Affect:  Flat and Tearful  Thought Process:  Coherent  Orientation:  Full (Time, Place, and Person)  Thought Content: Logical   Suicidal Thoughts:  No  Homicidal Thoughts:  No  Memory:  WNL  Judgement:  Good  Insight:  Good  Psychomotor Activity:  Normal  Concentration:  Concentration: Good  Recall:  Good  Fund of Knowledge: Good  Language: Good  Assets:  Desire for Improvement  ADL's:  Intact  Cognition: WNL  Prognosis:  Good    DIAGNOSES:    ICD-10-CM   1. Generalized anxiety disorder  F41.1 sertraline (ZOLOFT) 50 MG tablet    2. Mild episode of recurrent major depressive disorder (HCC)  F33.0 sertraline (ZOLOFT) 50 MG tablet      Receiving Psychotherapy: No   RECOMMENDATIONS:  Greater than 50% of  30 min face to face time with patient  was spent on counseling and coordination of care. We discussed her current depression and anxiety levels and no improvement since last increasing her dosage of Paxil to 50 mg.    Plan: Will wean off Paxil due to no significant improvement and reaching upward limit for advanced age.  Will start Zoloft 1/2 tablet 25 mg  daily for 7 days. Then one whole tablet  50 mg daily.   Using pill splitter patient will decrease 50 mg of paxil. She has a 40 mg tablet and a 10 mg tablet. First week she will take 1/2 of 10 mg tablet and the 40 mg tablet. Second week take 40 mg tablet. Third week take 3/4  tablet, 4th week take 1/2 tablet, 5th week take 1/4 tablet, then stop.  Will report worsening symptoms or side effects promptly Agreed to f/u in 4 weeks to reassess.   Pt was noticeably upset this visit and it seemed to facilitate some confusion. Will assess changes in cognition next visit.   Provided emergency contact information.    Elwanda Brooklyn, NP

## 2021-07-05 ENCOUNTER — Telehealth: Payer: Self-pay | Admitting: Behavioral Health

## 2021-07-05 NOTE — Telephone Encounter (Signed)
She is doing it correctly. She was to start the zoloft at same time of weaning off the paxil.

## 2021-07-05 NOTE — Telephone Encounter (Signed)
Pt stated she has weaning off paxil as instructed but also started the 1/2 tab of zoloft and has been taking for 5 days.She wants to know if she was suppose to start the Zoloft right away,or finish weaning off paxil first?

## 2021-07-05 NOTE — Telephone Encounter (Signed)
Pt informed

## 2021-07-05 NOTE — Telephone Encounter (Signed)
Pt called and said that she took the wrong medications this am . She doesn't know what to do. Please call her to discuss at [phone]

## 2021-07-06 ENCOUNTER — Encounter: Payer: Self-pay | Admitting: Neurology

## 2021-07-06 ENCOUNTER — Ambulatory Visit: Payer: Medicare PPO | Admitting: Neurology

## 2021-07-06 VITALS — BP 179/94 | HR 78 | Ht 67.0 in | Wt 143.2 lb

## 2021-07-06 DIAGNOSIS — G20 Parkinson's disease: Secondary | ICD-10-CM | POA: Diagnosis not present

## 2021-07-06 DIAGNOSIS — R269 Unspecified abnormalities of gait and mobility: Secondary | ICD-10-CM

## 2021-07-06 DIAGNOSIS — R0683 Snoring: Secondary | ICD-10-CM | POA: Diagnosis not present

## 2021-07-06 DIAGNOSIS — M5441 Lumbago with sciatica, right side: Secondary | ICD-10-CM

## 2021-07-06 DIAGNOSIS — G8929 Other chronic pain: Secondary | ICD-10-CM | POA: Diagnosis not present

## 2021-07-06 DIAGNOSIS — R413 Other amnesia: Secondary | ICD-10-CM | POA: Diagnosis not present

## 2021-07-06 MED ORDER — CARBIDOPA-LEVODOPA ER 25-100 MG PO TBCR: 1.0000 | EXTENDED_RELEASE_TABLET | Freq: Every day | ORAL | 11 refills | Status: DC

## 2021-07-06 MED ORDER — CARBIDOPA-LEVODOPA 25-100 MG PO TABS: 1.0000 | ORAL_TABLET | Freq: Three times a day (TID) | ORAL | 4 refills | Status: DC

## 2021-07-06 NOTE — Patient Instructions (Signed)
Meds ordered this encounter  Medications   carbidopa-levodopa (SINEMET IR) 25-100 MG tablet    Sig: Take 1 tablet by mouth 3 (three) times daily.    Dispense:  270 tablet    Refill:  4   Carbidopa-Levodopa ER (SINEMET CR) 25-100 MG tablet controlled release    Sig: Take 1 tablet by mouth at bedtime.    Dispense:  30 tablet    Refill:  11

## 2021-07-06 NOTE — Progress Notes (Signed)
ASSESSMENT AND PLAN 75 y.o. year old female     Idiopathic Parkinson's disease  Complains of difficulty starting from morning, advised patient take her first dose of Sinemet 25/100 immediate release as soon as she get up, every 4 hours, and around Sinemet 25/100 controlled release 1 tablet  at bedtime Datscan in May 2021 confirmed decreased radiotracer activity at bilateral stratum right worse than left  Mild cognitive impairment MOCA 25/30 -MRI of the brain showed mild age-related changes, no acute abnormality Laboratory evaluation showed no treatable etiology  Worsening low back pain, radiating pain to right hip, and lower extremity gait abnormality, urinary urgency MRI of lumbar spine to rule out right lumbar radiculopathy    DIAGNOSTIC DATA (LABS, IMAGING, TESTING) - I reviewed patient records, labs, notes, testing and imaging myself where available.  Lab in 2022, normal CBC, Hg 14, BMP, creat 1.18, TSH 2.9,  Lipase 39,   2021, normal B12 719  HISTORY OF PRESENT ILLNESS: Desiree Allen is a 75 year old female, seen in request by her primary care physician Little, Lennette Bihari for evaluation of bilateral hands tremor, initial evaluation was on March 25, 2020.   I have reviewed and summarized the referring note from the referring physician.  She has past medical history of anxiety, taking Paxil 40 mg daily, is a retired Airline pilot, she also had a history of right arm melanoma resection in 2012.   I saw her initially in November 2017 for bilateral hands tremor, she did complaints lost sense of smell at that time, also vivid dreams, but no excessive movement during her sleep, there was no family history of tremor.   At that visit, she was noted to have fairly symmetric bilateral hands posturing tremor, was diagnosed with possible essential tremor, she denies significant functional limitation in her daily activity   Over the years, bilateral hands tremor gradually getting  worse, now she also complains of mild gait abnormality, especially since her fall by accident in November 2020, she also complains of stress incontinence, frequent nocturia, which has helped by oxybutynin   She continues to notice decreased sense of smell, no REM sleep disorder, she also concerned about her gradual onset mild memory loss,   Today's examination showed clearly parkinsonian features, mild bilateral upper extremity rigidity, bradykinesia, but fairly symmetric resting tremor, decreased bilateral arm swing, ambulate   UPDATE July 8th 2021 She reported 60% improvement after starting Sinemet 25/100 mg 3 times a day, she can walk better, less tremor, she did occasionally noticed end dose wearing off, discussed formal diagnosis of Parkinson's disease   Laboratory evaluation from primary care in September 2020 normal CBC, hemoglobin of 14.2, LDL 87, CMP   Personally reviewed DAT scan May 2021   Decrease radiotracer activity within the LEFT and RIGHT stratum is a pattern suggestive of Parkinson's syndrome pathology.   Greater loss of dopamine transport populations on the RIGHT compared to the LEFT.   She continue complains of mild memory loss, urinary incontinence, hyperreflexia on examination,   UPDATE July 06 2021: She is accompanied by her husband at today's visit, she continue has mild depression anxiety, is coming off Paxil, replace with Zoloft 50 mg daily,  She takes Sinemet at 730am, 1pm, 530pm, she goes to bed 10pm, get up 7am.  Early morning is the most difficult time for her, it is hard for her to start moving, felt slow, sluggish, then gets better as day goes by,  She also complains of worsening low  back pain, rating pain to right hip, has slow worsening urinary incontinence, wear depends at night, also have accident, it is hard for her to stay in public, worry about bladder incontinence,  She is physically active, exercise regularly, busy at her household chores,  TSH,  B12 are normal.  We personally reviewed MRI of cervical spine in July 2021, multilevel degenerative changes, no evidence of canal stenosis, variable degree of foraminal narrowing, most severe at C5-6, C3-4, C4-5  MRI of the brain no acute abnormality, supratentorium small vessel disease     PHYSICAL EXAM  Vitals:   07/06/21 1100  BP: (!) 179/94  Pulse: 78  Weight: 143 lb 3.2 oz (65 kg)  Height: '5\' 7"'  (1.702 m)   Body mass index is 22.43 kg/m.  Generalized: Well developed, in no acute distress  Montreal Cognitive Assessment  07/06/2021  Visuospatial/ Executive (0/5) 3  Naming (0/3) 3  Attention: Read list of digits (0/2) 2  Attention: Read list of letters (0/1) 1  Attention: Serial 7 subtraction starting at 100 (0/3) 3  Language: Repeat phrase (0/2) 2  Language : Fluency (0/1) 1  Abstraction (0/2) 2  Delayed Recall (0/5) 3  Orientation (0/6) 6  Total 26    PHYSICAL EXAMNIATION:  Gen: NAD, conversant, well nourised, well groomed        NEUROLOGICAL EXAM:  MENTAL STATUS: Speech/Cognition: Awake, alert, normal speech, oriented to history taking and casual conversation.  CRANIAL NERVES: CN II: Visual fields are full to confrontation.  Pupils are round equal and briskly reactive to light. CN III, IV, VI: extraocular movement are normal. No ptosis. CN V: Facial sensation is intact to light touch. CN VII: Face is symmetric with normal eye closure and smile. CN VIII: Hearing is normal to casual conversation CN IX, X: Palate elevates symmetrically. Phonation is normal. CN XI: Head turning and shoulder shrug are intact  MOTOR: Mild left more than right rigidity, bradykinesia  REFLEXES: Reflexes are 2+ and symmetric at the biceps, triceps, knees and absent at ankles. Plantar responses are flexor.  SENSORY: Length dependent decreased light touch, vibratory sensation pinprick to above ankle level  COORDINATION: There is no trunk or limb ataxia.    GAIT/STANCE: She  can get up from seated position arm crossed, mildly decreased arm swing, fairly steady  REVIEW OF SYSTEMS: Out of a complete 14 system review of symptoms, the patient complains only of the following symptoms, and all other reviewed systems are negative.  Tremor  ALLERGIES: Allergies  Allergen Reactions   Penicillins Rash    HOME MEDICATIONS: Outpatient Medications Prior to Visit  Medication Sig Dispense Refill   atorvastatin (LIPITOR) 20 MG tablet Take 20 mg by mouth daily.      carbidopa-levodopa (SINEMET IR) 25-100 MG tablet Take 1 tablet by mouth 3 (three) times daily. 270 tablet 4   Cholecalciferol (VITAMIN D) 2000 units CAPS Take by mouth daily.     fludrocortisone (FLORINEF) 0.1 MG tablet Take 1 tablet (0.1 mg total) by mouth daily. 90 tablet 3   Multiple Vitamins-Minerals (WOMENS MULTIVITAMIN PO) Take by mouth daily.     Omega-3 Fatty Acids (FISH OIL) 1200 MG CAPS Take 1,200 mg by mouth daily.     PARoxetine (PAXIL) 10 MG tablet Take 1 tablet (10 mg total) by mouth daily. 90 tablet 0   PARoxetine (PAXIL) 40 MG tablet Take 40 mg by mouth daily.      sertraline (ZOLOFT) 50 MG tablet Take 1/2 tablet 25 mg daily for  7 days. Then take one whole tablet 50 mg daily. 30 tablet 1   No facility-administered medications prior to visit.    PAST MEDICAL HISTORY: Past Medical History:  Diagnosis Date   Depression with anxiety    GERD (gastroesophageal reflux disease)    Hyperplastic colon polyp    Melanoma ([place]) 2012   Mixed dyslipidemia    Tremor    Venous insufficiency of both lower extremities    Vitamin D deficiency     PAST SURGICAL HISTORY: Past Surgical History:  Procedure Laterality Date   BUNIONECTOMY Left    CATARACT EXTRACTION Bilateral    MELANOMA EXCISION  2012    FAMILY HISTORY: Family History  Problem Relation Age of Onset   Multiple myeloma Mother    Anemia Father     SOCIAL HISTORY: Social History   Socioeconomic History   Marital status: Married     Spouse name: Roger   Number of children: 3   Years of education: Bachelors   Highest education level: Not on file  Occupational History   Occupation: Retired  Tobacco Use   Smoking status: Never   Smokeless tobacco: Never  Scientific laboratory technician Use: Never used  Substance and Sexual Activity   Alcohol use: No   Drug use: No   Sexual activity: Not on file  Other Topics Concern   Not on file  Social History Narrative   Lives at home with husband.   Left-handed.   No caffeine use.   Social Determinants of Health   Financial Resource Strain: Not on file  Food Insecurity: Not on file  Transportation Needs: Not on file  Physical Activity: Not on file  Stress: Not on file  Social Connections: Not on file  Intimate Partner Violence: Not on file

## 2021-07-08 ENCOUNTER — Telehealth: Payer: Self-pay | Admitting: Behavioral Health

## 2021-07-08 ENCOUNTER — Encounter: Payer: Self-pay | Admitting: Neurology

## 2021-07-08 NOTE — Telephone Encounter (Signed)
Yes, this could be the case. It is probably not from the zoloft but from reducing off Paxil. It is one of hardest to wean off of. Please let her know that is only option if she wants to change meds.

## 2021-07-08 NOTE — Telephone Encounter (Signed)
Pt stated she started taking 1 of both meds this AM and it seems like they are "fighting her body"She stated she had a bad panic attack this morning and is not sure if the meds caused this or not

## 2021-07-08 NOTE — Telephone Encounter (Signed)
She should continue taking the zoloft  1/2 tablet 25 mg  for 7 days. Please advise that she should not be taking whole pill. She should be reducing the Paxil weekly as instructed. She should only be taking the one 40 mg tablet this week.

## 2021-07-08 NOTE — Telephone Encounter (Signed)
Pt informed

## 2021-07-08 NOTE — Telephone Encounter (Signed)
She did take 1/2 tab of zoloft for 7 days and today she started taking 1 whole tab as instructed.She wants to know is she having a side affect or symptoms coming from weaning off the Paxil

## 2021-07-08 NOTE — Telephone Encounter (Signed)
Patient lm stating she is in need of help concerning the Paxil and Zoloft. # [phone].

## 2021-07-19 ENCOUNTER — Other Ambulatory Visit: Payer: Self-pay

## 2021-07-19 ENCOUNTER — Ambulatory Visit
Admission: RE | Admit: 2021-07-19 | Discharge: 2021-07-19 | Disposition: A | Discharge status: Home / Self Care | Payer: Medicare PPO | Source: Ambulatory Visit | Attending: Neurology | Admitting: Neurology

## 2021-07-19 DIAGNOSIS — R269 Unspecified abnormalities of gait and mobility: Secondary | ICD-10-CM

## 2021-07-19 DIAGNOSIS — G8929 Other chronic pain: Secondary | ICD-10-CM

## 2021-07-19 DIAGNOSIS — G20 Parkinson's disease: Secondary | ICD-10-CM

## 2021-07-19 DIAGNOSIS — M48061 Spinal stenosis, lumbar region without neurogenic claudication: Secondary | ICD-10-CM | POA: Diagnosis not present

## 2021-07-19 DIAGNOSIS — R413 Other amnesia: Secondary | ICD-10-CM

## 2021-07-19 DIAGNOSIS — M545 Low back pain, unspecified: Secondary | ICD-10-CM | POA: Diagnosis not present

## 2021-07-20 ENCOUNTER — Telehealth: Payer: Self-pay | Admitting: Neurology

## 2021-07-20 DIAGNOSIS — M545 Low back pain, unspecified: Secondary | ICD-10-CM

## 2021-07-20 MED ORDER — GABAPENTIN 100 MG PO CAPS: 200.0000 mg | ORAL_CAPSULE | Freq: Three times a day (TID) | ORAL | 6 refills | Status: DC

## 2021-07-20 NOTE — Addendum Note (Signed)
Addended by: Marcial Pacas on: 07/20/2021 12:00 PM   Modules accepted: Orders

## 2021-07-20 NOTE — Telephone Encounter (Signed)
     Meds ordered this encounter  Medications   gabapentin (NEURONTIN) 100 MG capsule    Sig: Take 2 capsules (200 mg total) by mouth 3 (three) times daily.    Dispense:  180 capsule    Refill:  6  Gabapentin 100 mg up to 2 tablets 3 times a day

## 2021-07-20 NOTE — Telephone Encounter (Signed)
  IMPRESSION: 1. Moderate scoliosis with mild degenerative appearing marrow edema in bulky right far lateral endplate osteophytes at L2-L3. No other acute osseous abnormality.   2. Chronic disc, endplate, and posterior element degeneration elsewhere but no significant spinal stenosis. Occasional mild lateral recess stenosis. Moderate neural foraminal stenosis at the right L3, left L4, and left L5 nerve levels  Please call patient MRI of the lumbar report, moderate scoliosis, moderate neuroforaminal stenosis at right L3, left L4 and 5,  Please also check on her low back pain, to see if she needs extra medication help, such as gabapentin, if she has significant pain, may even consider pain management refer

## 2021-07-20 NOTE — Addendum Note (Signed)
Addended by: Noberto Retort C on: 07/20/2021 12:07 PM   Modules accepted: Orders

## 2021-07-20 NOTE — Telephone Encounter (Signed)
I called the patient back and reviewed the gabapentin prescription with her. She would also like to the referral to pain management. Order placed.

## 2021-07-20 NOTE — Telephone Encounter (Signed)
I spoke to the patient and she verbalized understanding of the findings. She would like to try a low dose gabapentin for her back pain. If this is not helpful, then she will call back to pursue the pain management referral.

## 2021-07-21 ENCOUNTER — Telehealth: Payer: Self-pay

## 2021-07-21 NOTE — Telephone Encounter (Signed)
Referral for pain clinic sent to [facility]. P: [phone].

## 2021-07-21 NOTE — Telephone Encounter (Signed)
We received a call from [facility] regarding this patients outgoing referral to them. They advised that the patient declined to schedule an appointment. Referral updated.

## 2021-07-27 ENCOUNTER — Telehealth: Payer: Self-pay | Admitting: Internal Medicine

## 2021-07-27 ENCOUNTER — Telehealth: Payer: Self-pay | Admitting: Neurology

## 2021-07-27 NOTE — Telephone Encounter (Signed)
Called pt in regards to medication(s).  She reports that she was recently started on a new medication for Parkinson's.  Sinemet SR PO QHS, since starting this medication she notes that she is lightheaded and dizzy in the mornings when she gets up.  She called neurology office and was told not to take med tonight to see if symptoms improve.  I spoke with Dr. Gasper Sells he recommends pt sleep at a 30 degree angle and check BP every morning.  She will perform BP checks for 3 days and send in readings through my chart.  MD will consider changes once he has reviewed readings.  Pt verbalizes understanding.

## 2021-07-27 NOTE — Telephone Encounter (Signed)
Pt called stating that she thinks the fludrocortisone (FLORINEF) 0.1 MG tablet and the Carbidopa-Levodopa ER (SINEMET CR) 25-100 MG tablet controlled release are not doing well together. Pt is requesting a call back.

## 2021-07-27 NOTE — Telephone Encounter (Signed)
Returned patient's call.    She was given the Florinef in February to take at Bedtime.  She has had no problems and saw Dr. Krista Blue a week ago, and was given an additional Sinemet 25-100 and now is back to feeling woozy and dizzy in the morning, she feels it is because of the extended release Sinemet.    Patient has not been taking her blood pressure and was advised to take her blood pressure before she goes to bed and again when she gets up.  She is also going to call Dr. Gasper Sells who prescribed the Florinef.  She is asking if she needs to continue to take the Sinemet ER or if it could be why she feels woozy in the mornings again?

## 2021-07-27 NOTE — Telephone Encounter (Signed)
Pt c/o medication issue: 1. Name of Medication: Fludrocortisone  2. How are you currently taking this medication (dosage and times per day)? 0.1 once a day 3. Are you having a reaction (difficulty breathing--STAT)?  No  4. What is your medication issue? Neurologist put her on carbidopa-levodopa patient her BP medication is not working that well.

## 2021-07-28 ENCOUNTER — Telehealth (INDEPENDENT_AMBULATORY_CARE_PROVIDER_SITE_OTHER): Payer: Medicare PPO | Admitting: Behavioral Health

## 2021-07-28 ENCOUNTER — Encounter: Payer: Self-pay | Admitting: Behavioral Health

## 2021-07-28 ENCOUNTER — Telehealth: Payer: Self-pay

## 2021-07-28 DIAGNOSIS — F411 Generalized anxiety disorder: Secondary | ICD-10-CM | POA: Diagnosis not present

## 2021-07-28 DIAGNOSIS — F330 Major depressive disorder, recurrent, mild: Secondary | ICD-10-CM | POA: Diagnosis not present

## 2021-07-28 MED ORDER — SERTRALINE HCL 50 MG PO TABS: ORAL_TABLET | ORAL | 3 refills | Status: DC

## 2021-07-28 NOTE — Progress Notes (Signed)
Desiree Allen [id number] 06-01-1946 75 y.o.  Virtual Visit via Video Note  I connected with pt @ on 07/28/21 at  1:00 PM EDT by a video enabled telemedicine application and verified that I am speaking with the correct person using two identifiers.   I discussed the limitations of evaluation and management by telemedicine and the availability of in person appointments. The patient expressed understanding and agreed to proceed.  I discussed the assessment and treatment plan with the patient. The patient was provided an opportunity to ask questions and all were answered. The patient agreed with the plan and demonstrated an understanding of the instructions.   The patient was advised to call back or seek an in-person evaluation if the symptoms worsen or if the condition fails to improve as anticipated.  I provided 30 minutes of non-face-to-face time during this encounter.  The patient was located at home.  The provider was located at [place].   Elwanda Brooklyn, NP   Subjective:   Patient ID:  Desiree Allen is a 75 y.o. (DOB [birth year]) female.  Chief Complaint:  Chief Complaint  Patient presents with   Anxiety   Depression   Follow-up   Medication Refill   Medication Problem    HPI Desiree Allen presents for follow-up and medication management with refills provided. She says that she feel like she is "turning the corner" with weaning off the Paxil. She said that her memory and confusion has improved somewhat since last visit. She feels like she is finally adjusting to the Zoloft 50 mg and she is finishing up final titration of Paxil this week and will stop. She is alert and oriented x 3 this visit. She says her neurologist said she could stop taking her evening dose of Carbidopa-Levodopa and she feels like she has seen some improvement with "fuzzy thinking". She says her anxiety and depression has improved to what she calls a manageable level. She reports anxiety 3/10 and  depression at 3/10. Says that she is sleeping 7 hours per night. No mania, no psychosis. Some short term memory loss as expected with advanced age. No SI/HI.   Past  psychiatric medications Prozac Paxil    Review of Systems:  Review of Systems  Constitutional: Negative.   Allergic/Immunologic: Negative.   Neurological:  Positive for dizziness, tremors, weakness and light-headedness.  Psychiatric/Behavioral:  Positive for dysphoric mood. The patient is nervous/anxious.    Medications: I have reviewed the patient's current medications.  Current Outpatient Medications  Medication Sig Dispense Refill   atorvastatin (LIPITOR) 20 MG tablet Take 20 mg by mouth daily.      carbidopa-levodopa (SINEMET IR) 25-100 MG tablet Take 1 tablet by mouth 3 (three) times daily. 270 tablet 4   Carbidopa-Levodopa ER (SINEMET CR) 25-100 MG tablet controlled release Take 1 tablet by mouth at bedtime. 30 tablet 11   Cholecalciferol (VITAMIN D) 2000 units CAPS Take by mouth daily.     fludrocortisone (FLORINEF) 0.1 MG tablet Take 1 tablet (0.1 mg total) by mouth daily. 90 tablet 3   gabapentin (NEURONTIN) 100 MG capsule Take 2 capsules (200 mg total) by mouth 3 (three) times daily. 180 capsule 6   Multiple Vitamins-Minerals (WOMENS MULTIVITAMIN PO) Take by mouth daily.     Omega-3 Fatty Acids (FISH OIL) 1200 MG CAPS Take 1,200 mg by mouth daily.     sertraline (ZOLOFT) 50 MG tablet Take one tablet daily after breakfast. 30 tablet 3   No current facility-administered medications  for this visit.    Medication Side Effects: None  Allergies:  Allergies  Allergen Reactions   Penicillins Rash    Past Medical History:  Diagnosis Date   Depression with anxiety    GERD (gastroesophageal reflux disease)    Hyperplastic colon polyp    Melanoma ([place]) 2012   Mixed dyslipidemia    Tremor    Venous insufficiency of both lower extremities    Vitamin D deficiency     Family History  Problem Relation Age of  Onset   Multiple myeloma Mother    Anemia Father     Social History   Socioeconomic History   Marital status: Married    Spouse name: [name]   Number of children: 3   Years of education: Bachelors   Highest education level: Not on file  Occupational History   Occupation: Retired  Tobacco Use   Smoking status: Never   Smokeless tobacco: Never  Vaping Use   Vaping Use: Never used  Substance and Sexual Activity   Alcohol use: No   Drug use: No   Sexual activity: Not on file  Other Topics Concern   Not on file  Social History Narrative   Lives at home with husband.   Left-handed.   No caffeine use.   Social Determinants of Health   Financial Resource Strain: Not on file  Food Insecurity: Not on file  Transportation Needs: Not on file  Physical Activity: Not on file  Stress: Not on file  Social Connections: Not on file  Intimate Partner Violence: Not on file    Past Medical History, Surgical history, Social history, and Family history were reviewed and updated as appropriate.   Please see review of systems for further details on the patient's review from today.   Objective:   Physical Exam:  There were no vitals taken for this visit.  Physical Exam Neurological:     Mental Status: She is alert and oriented to person, place, and time.  Psychiatric:        Attention and Perception: Attention and perception normal.        Mood and Affect: Mood normal.        Speech: Speech normal.        Behavior: Behavior normal. Behavior is cooperative.        Cognition and Memory: Cognition and memory normal.        Judgment: Judgment normal.     Comments: Insight intact    Lab Review:     Component Value Date/Time   NA 140 02/06/2021 1547   K 4.1 02/06/2021 1547   CL 102 02/06/2021 1547   CO2 27 02/06/2021 1547   GLUCOSE 120 (H) 02/06/2021 1547   BUN 22 02/06/2021 1547   CREATININE 1.18 (H) 02/06/2021 1547   CALCIUM 9.6 02/06/2021 1547   GFRNONAA 48 (L) 02/06/2021  1547       Component Value Date/Time   WBC 8.6 02/06/2021 1547   RBC 4.33 02/06/2021 1547   HGB 14.0 02/06/2021 1547   HCT 41.2 02/06/2021 1547   PLT 223 02/06/2021 1547   MCV 95.2 02/06/2021 1547   MCH 32.3 02/06/2021 1547   MCHC 34.0 02/06/2021 1547   RDW 13.1 02/06/2021 1547    No results found for: POCLITH, LITHIUM   No results found for: PHENYTOIN, PHENOBARB, VALPROATE, CBMZ   .res Assessment: Plan:    [place] was seen today for anxiety, depression, follow-up, medication refill and medication problem.  Diagnoses and all  orders for this visit:  Generalized anxiety disorder -     sertraline (ZOLOFT) 50 MG tablet; Take one tablet daily after breakfast.  Mild episode of recurrent major depressive disorder (HCC) -     sertraline (ZOLOFT) 50 MG tablet; Take one tablet daily after breakfast.   Greater than 50% of  30 min face to face time with patient was spent on counseling and coordination of care. We discussed her current depression and anxiety levels and and improvement self-reported to be manageable level. She is on last week of titration to discontinue Paxil. There is noticeable improvement in cognition this visit via video conference. Her speech is more precise. She does have some short term memory impairment which is to be expected due to age and Parkinson's disease. Pt is experiencing moderate improvement with anxiety and depression since last visit.   Continue Zoloft 50 mg daily Will report worsening symptoms or side effects promptly Agreed to 6 weeks to reassess. Provided emergency contact information. To follow up in 6 weeks to reassess.  3 Refills provided to pt's pharmacy for Zoloft.    Please see After Visit Summary for patient specific instructions.  Future Appointments  Date Time Provider [place]  01/10/2022  1:45 PM Suzzanne Cloud, NP [facility] None    No orders of the defined types were placed in this encounter.      -------------------------------

## 2021-07-29 NOTE — Telephone Encounter (Signed)
She has Parkinson's that is advancing and she is 89. She is doing all the things that can be done for now and she is following up with neurology. I did not detect anything unusually wrong with her specifically today. Thank you for the information.

## 2021-07-30 ENCOUNTER — Telehealth: Payer: Self-pay | Admitting: Internal Medicine

## 2021-07-30 NOTE — Telephone Encounter (Signed)
Pt was advised to call to report her BP for the past 3 days:  07/28/21-  Laying flat 172/84 Sitting 150/87 Standing 142/78  07/29/21 Laying flat 161/76 Sitting 156/80 Standing 110/60  07/30/21 Laying  160/92 Sitting 134/76 Standing 112/67

## 2021-07-30 NOTE — Telephone Encounter (Signed)
Spoke with pt and advised of Dr Oralia Rud recommendations as below:  Patient has neurogenic orthostatic hypotension that has worsened on her new sinamet.  We should  - stress the importance of salt and water intake  - gave education on slow rise, Valsalva maneuver exacerbation, temperature change  - discuss elevation to 30-45 degrees when sleeping  - offer compression stockings and abdominal binders (these can be purchased on commercial websites)  - recommend heating abdominal pad for supine hypertension   If her sx worsen with her new neurology medication, her neuologist may need to switch her therapy   Pt reports she only took the Sinemet CR - 1 tablet by mouth daily at bedtime for 2-3 days as it was causing an increase in dizziness.  Pt states she has not had any dizziness stopping medication and has made neurology aware. Pt verbalized of all instructions reviewed and agrees with current plan.

## 2021-08-03 ENCOUNTER — Telehealth: Payer: Self-pay | Admitting: Behavioral Health

## 2021-08-03 NOTE — Telephone Encounter (Signed)
Pt left a message that she is having side effects from her medication. Please give her a call at [phone]

## 2021-08-03 NOTE — Telephone Encounter (Signed)
Pt stated stated she has no appetite and is tired all the time.She says the same symptoms persist and she is not sure if it's coming from tapering of the paxil or not

## 2021-08-03 NOTE — Telephone Encounter (Signed)
Please advise her that coming off Paxil is difficult but there is no way to determine where fatigue is coming from especially due to advanced age and Parkinsons.  She is needs to continue medications as prescribed to see if she adjust to change. If she is not having  and severe issues or side effects, give it a few more weeks. Parkinsons medications can also increase fatigue.

## 2021-08-04 NOTE — Telephone Encounter (Signed)
Pt informed

## 2021-08-07 ENCOUNTER — Encounter (HOSPITAL_BASED_OUTPATIENT_CLINIC_OR_DEPARTMENT_OTHER): Payer: Self-pay

## 2021-08-07 ENCOUNTER — Other Ambulatory Visit: Payer: Self-pay

## 2021-08-07 ENCOUNTER — Emergency Department (HOSPITAL_BASED_OUTPATIENT_CLINIC_OR_DEPARTMENT_OTHER)
Admission: EM | Admit: 2021-08-07 | Discharge: 2021-08-07 | Disposition: A | Discharge status: Home / Self Care | Payer: Medicare PPO | Attending: Emergency Medicine | Admitting: Emergency Medicine

## 2021-08-07 DIAGNOSIS — I10 Essential (primary) hypertension: Secondary | ICD-10-CM | POA: Diagnosis not present

## 2021-08-07 DIAGNOSIS — G20 Parkinson's disease: Secondary | ICD-10-CM | POA: Diagnosis not present

## 2021-08-07 LAB — URINALYSIS, ROUTINE W REFLEX MICROSCOPIC
Bilirubin Urine: NEGATIVE
Glucose, UA: NEGATIVE mg/dL
Hgb urine dipstick: NEGATIVE
Ketones, ur: NEGATIVE mg/dL
Leukocytes,Ua: NEGATIVE
Nitrite: NEGATIVE
Protein, ur: NEGATIVE mg/dL
Specific Gravity, Urine: 1.006 (ref 1.005–1.030)
pH: 7 (ref 5.0–8.0)

## 2021-08-07 LAB — CBC
HCT: 38.8 % (ref 36.0–46.0)
Hemoglobin: 13.3 g/dL (ref 12.0–15.0)
MCH: 32.2 pg (ref 26.0–34.0)
MCHC: 34.3 g/dL (ref 30.0–36.0)
MCV: 93.9 fL (ref 80.0–100.0)
Platelets: 208 10*3/uL (ref 150–400)
RBC: 4.13 MIL/uL (ref 3.87–5.11)
RDW: 13.1 % (ref 11.5–15.5)
WBC: 8.5 10*3/uL (ref 4.0–10.5)
nRBC: 0 % (ref 0.0–0.2)

## 2021-08-07 LAB — BASIC METABOLIC PANEL
Anion gap: 7 (ref 5–15)
BUN: 17 mg/dL (ref 8–23)
CO2: 28 mmol/L (ref 22–32)
Calcium: 9 mg/dL (ref 8.9–10.3)
Chloride: 100 mmol/L (ref 98–111)
Creatinine, Ser: 0.74 mg/dL (ref 0.44–1.00)
GFR, Estimated: >60 mL/min (ref >60)
Glucose, Bld: 101 mg/dL — ABNORMAL HIGH (ref 70–99)
Potassium: 3.7 mmol/L (ref 3.5–5.1)
Sodium: 135 mmol/L (ref 135–145)

## 2021-08-07 MED ORDER — HYDRALAZINE HCL 10 MG PO TABS: 10.0000 mg | ORAL_TABLET | Freq: Two times a day (BID) | ORAL | 0 refills | Status: DC | PRN

## 2021-08-07 MED ORDER — AMLODIPINE BESYLATE 5 MG PO TABS
10.0000 mg | ORAL_TABLET | Freq: Once | ORAL | Status: AC
  Administered 2021-08-07: 10 mg via ORAL
  Filled 2021-08-07: qty 2

## 2021-08-07 NOTE — ED Provider Notes (Signed)
[facility] EMERGENCY DEPT Provider Note   CSN: [id number] Arrival date & time: 08/07/21  2116     History Chief Complaint  Patient presents with   Hypertension    Desiree Allen is a 75 y.o. female.  Patient presents to ER chief complaint of "not feeling right."  She has a history of Parkinson's neurogenic orthostatic hypotension and is on Florinef.  However blood pressure at home was greater than 638 systolic.  She denies any headache or chest pain.  No fever no cough no vomiting no diarrhea.  She states that she feels shaky and "off."      Past Medical History:  Diagnosis Date   Depression with anxiety    GERD (gastroesophageal reflux disease)    Hyperplastic colon polyp    Melanoma ([place]) 2012   Mixed dyslipidemia    Tremor    Venous insufficiency of both lower extremities    Vitamin D deficiency     Patient Active Problem List   Diagnosis Date Noted   Parkinson's disease ([place]) 07/06/2021   Chronic right-sided low back pain with right-sided sciatica 07/06/2021   Snores 07/06/2021   Other hyperlipidemia 05/28/2021   Family history of aortic aneurysm 02/18/2021   Memory loss 03/24/2020   Parkinson's disease with neurogenic orthostatic hypotension ([place]) 03/24/2020   Gait abnormality 03/24/2020   Pleural effusion on left 12/02/2019   Essential tremor 11/21/2016    Past Surgical History:  Procedure Laterality Date   BUNIONECTOMY Left    CATARACT EXTRACTION Bilateral    MELANOMA EXCISION  2012     OB History   No obstetric history on file.     Family History  Problem Relation Age of Onset   Multiple myeloma Mother    Anemia Father     Social History   Tobacco Use   Smoking status: Never   Smokeless tobacco: Never  Vaping Use   Vaping Use: Never used  Substance Use Topics   Alcohol use: No   Drug use: No    Home Medications Prior to Admission medications   Medication Sig Start Date End Date Taking? Authorizing Provider   hydrALAZINE (APRESOLINE) 10 MG tablet Take 1 tablet (10 mg total) by mouth 2 (two) times daily as needed for up to 10 doses. 08/07/21  Yes Luna Fuse, MD  atorvastatin (LIPITOR) 20 MG tablet Take 20 mg by mouth daily.  09/29/16   [provider]  carbidopa-levodopa (SINEMET IR) 25-100 MG tablet Take 1 tablet by mouth 3 (three) times daily. 07/06/21   Marcial Pacas, MD  Carbidopa-Levodopa ER (SINEMET CR) 25-100 MG tablet controlled release Take 1 tablet by mouth at bedtime. 07/06/21   Marcial Pacas, MD  Cholecalciferol (VITAMIN D) 2000 units CAPS Take by mouth daily.    [provider]  fludrocortisone (FLORINEF) 0.1 MG tablet Take 1 tablet (0.1 mg total) by mouth daily. 02/22/21   Werner Lean, MD  gabapentin (NEURONTIN) 100 MG capsule Take 2 capsules (200 mg total) by mouth 3 (three) times daily. 07/20/21   Marcial Pacas, MD  Multiple Vitamins-Minerals (WOMENS MULTIVITAMIN PO) Take by mouth daily.    [provider]  Omega-3 Fatty Acids (FISH OIL) 1200 MG CAPS Take 1,200 mg by mouth daily.    [provider]  sertraline (ZOLOFT) 50 MG tablet Take one tablet daily after breakfast. 07/28/21   Elwanda Brooklyn, NP    Allergies    Penicillins  Review of Systems   Review of Systems  Constitutional:  Negative for fever.  HENT:  Negative for ear pain.   Eyes:  Negative for pain.  Respiratory:  Negative for cough.   Cardiovascular:  Negative for chest pain.  Gastrointestinal:  Negative for abdominal pain.  Genitourinary:  Negative for flank pain.  Musculoskeletal:  Negative for back pain.  Skin:  Negative for rash.  Neurological:  Negative for headaches.   Physical Exam Updated Vital Signs BP (!) 189/86   Pulse 83   Temp 97.9 F (36.6 C) (Oral)   Resp 19   Ht '5\' 7"'  (1.702 m)   Wt 63.5 kg   SpO2 100%   BMI 21.93 kg/m   Physical Exam Constitutional:      General: She is not in acute distress.    Appearance: Normal appearance.  HENT:     Head:  Normocephalic.     Nose: Nose normal.  Eyes:     Extraocular Movements: Extraocular movements intact.  Cardiovascular:     Rate and Rhythm: Normal rate.  Pulmonary:     Effort: Pulmonary effort is normal.  Musculoskeletal:        General: Normal range of motion.     Cervical back: Normal range of motion.  Neurological:     General: No focal deficit present.     Mental Status: She is alert. Mental status is at baseline.    ED Results / Procedures / Treatments   Labs (all labs ordered are listed, but only abnormal results are displayed) Labs Reviewed  BASIC METABOLIC PANEL - Abnormal; Notable for the following components:      Result Value   Glucose, Bld 101 (*)    All other components within normal limits  URINALYSIS, ROUTINE W REFLEX MICROSCOPIC - Abnormal; Notable for the following components:   Color, Urine COLORLESS (*)    All other components within normal limits  CBC  CBG MONITORING, ED    EKG None  Radiology No results found.  Procedures Procedures   Medications Ordered in ED Medications  amLODipine (NORVASC) tablet 10 mg (10 mg Oral Given 08/07/21 2201)    ED Course  I have reviewed the triage vital signs and the nursing notes.  Pertinent labs & imaging results that were available during my care of the patient were reviewed by me and considered in my medical decision making (see chart for details).    MDM Rules/Calculators/A&P                           Blood pressure on arrival was very high greater than 060 systolic.  Repeat blood pressure still remains high at 156 systolic.  Patient given Norvasc with improvement of blood pressure.  Labs are sent is unremarkable EKG is unremarkable sinus rhythm no ST elevations depressions normal rhythm.  Patient appears improved with mild blood pressure improvement.  Recommending close monitoring by her primary care doctor within the week.  Recommending immediate return for any pain difficulty breathing or any  additional concerns.  Final Clinical Impression(s) / ED Diagnoses Final diagnoses:  Hypertension, unspecified type    Rx / DC Orders ED Discharge Orders          Ordered    hydrALAZINE (APRESOLINE) 10 MG tablet  2 times daily PRN        08/07/21 2239             Luna Fuse, MD 08/07/21 2240

## 2021-08-07 NOTE — ED Triage Notes (Signed)
Patient here POV from Home with BP Problems.  Patient awoke this AM "not feeling right". Patient took BP this AM and was low (75/47) but was high this PM (196/92). Patient states it has been "Up and Down" throughout the day. Patient states she feels "shaky on the inside".   Mild Nausea. A&Ox4. GCS 15. Hx of Parkinson's. Hx of Orthostatic Hypotension.

## 2021-08-07 NOTE — Discharge Instructions (Addendum)
Check your blood pressure daily.  If your blood pressure is greater than 0000000 systolic, take 1 tablet of hydralazine.  Follow-up with your doctor within the week.  Return to the ER if you have fevers pain or any additional concerns.

## 2021-08-10 ENCOUNTER — Telehealth: Payer: Self-pay | Admitting: Behavioral Health

## 2021-08-10 DIAGNOSIS — I951 Orthostatic hypotension: Secondary | ICD-10-CM | POA: Diagnosis not present

## 2021-08-10 NOTE — Telephone Encounter (Signed)
Pt informed

## 2021-08-10 NOTE — Telephone Encounter (Signed)
Next visit is 09/08/21. Desiree Allen called and has titrated off of Paxil and put on Zoloft 50 mg now. She is now very anxious. When can Zoloft be increased? Phone number is [phone].

## 2021-08-10 NOTE — Telephone Encounter (Signed)
Please review

## 2021-08-10 NOTE — Telephone Encounter (Signed)
Please advise her to increase her dose to  100 mg daily. She will be taking two 50 mg tablets at once. It will take a few weeks to know if it is going to help. Tell her that she may notice a little increase in anxiety before it gets better due to the drug being more activating in the beginning but if no severe side effects occur, she should continue.

## 2021-08-11 ENCOUNTER — Telehealth: Payer: Self-pay | Admitting: Internal Medicine

## 2021-08-11 NOTE — Telephone Encounter (Signed)
   Pt c/o medication issue:  1. Name of Medication:   fludrocortisone (FLORINEF) 0.1 MG tablet    2. How are you currently taking this medication (dosage and times per day)? Take 1 tablet (0.1 mg total) by mouth daily.  3. Are you having a reaction (difficulty breathing--STAT)?   4. What is your medication issue? Pt said she went to her pcp and was told to cut back dosage of her fludrocortisone because her meds for depression and anxiety was increased. She wanted to know if Dr. Gasper Sells is ok with that

## 2021-08-12 NOTE — Telephone Encounter (Signed)
Called pt reviewed MD recommendations pt verbalizes understanding.  All questions answered.

## 2021-08-20 ENCOUNTER — Telehealth: Payer: Self-pay | Admitting: Internal Medicine

## 2021-08-20 DIAGNOSIS — R32 Unspecified urinary incontinence: Secondary | ICD-10-CM | POA: Diagnosis not present

## 2021-08-20 DIAGNOSIS — F419 Anxiety disorder, unspecified: Secondary | ICD-10-CM | POA: Diagnosis not present

## 2021-08-20 DIAGNOSIS — N189 Chronic kidney disease, unspecified: Secondary | ICD-10-CM | POA: Diagnosis not present

## 2021-08-20 DIAGNOSIS — R251 Tremor, unspecified: Secondary | ICD-10-CM | POA: Diagnosis not present

## 2021-08-20 DIAGNOSIS — I951 Orthostatic hypotension: Secondary | ICD-10-CM | POA: Diagnosis not present

## 2021-08-20 DIAGNOSIS — Z6822 Body mass index (BMI) 22.0-22.9, adult: Secondary | ICD-10-CM | POA: Diagnosis not present

## 2021-08-20 DIAGNOSIS — F329 Major depressive disorder, single episode, unspecified: Secondary | ICD-10-CM | POA: Diagnosis not present

## 2021-08-20 DIAGNOSIS — G20 Parkinson's disease: Secondary | ICD-10-CM | POA: Diagnosis not present

## 2021-08-20 NOTE — Telephone Encounter (Signed)
Called pt back to inform her of MD recommendations.  She reports she had an OV with PCP and was advised to increase fludrocortsione back to previous dose and f/u with cardiology.  Appointment made for 08/23/21 at 10:20 am.

## 2021-08-20 NOTE — Telephone Encounter (Signed)
Pt c/o BP issue: STAT if pt c/o blurred vision, one-sided weakness or slurred speech  1. What are your last 5 BP readings? 161/80, 143/77, 63/51  2. Are you having any other symptoms (ex. Dizziness, headache, blurred vision, passed out)? Light headed  3. What is your BP issue? Blood pressure is up and down

## 2021-08-20 NOTE — Telephone Encounter (Signed)
Called pt in regards to BP fluctuating between high and low.  She reports she has felt strange since last night.  This morning she did orthostatic BP lying 161/80, sitting 143/77 and standing 63/51 with associated light headedness with standing.  Pt called in on 8/17 to report PCP suggested a decrease in fludrocortisone d/t an increase in anxiety and depression meds.  This may be contributing to BP fluctuation.  I will route to MD to address.

## 2021-08-22 NOTE — Progress Notes (Signed)
Cardiology Office Note:    Date:  08/23/2021   ID:  TENAE GRAZIOSI, DOB 1946/07/30, MRN [id number]  PCP:  [facility]  Cardiologist:  Werner Lean, MD  Advanced Practice Provider:  No care team member to display Electrophysiologist:  None      CC: Orthostatic Hypotension in the setting of Parkinson's Disease Follow up.  History of Present Illness:    Desiree Allen is a 75 y.o. female with a hx of Orthostatic Hypotension and Parkinson's Disease who presents for evaluation 02/18/21. In interim of this visit, patient was unable to get droxidopa and was started on florinef.  Patient was unable to reach PCP about hospital bed and we assisted with this one time (patient deferred in the setting of a used hospital bed). Had recent return of symptoms prompting urgent visit 08/23/21.  Patient notes that she is doing better than Friday.  Since her florinef was reduced she has had worsened near syncope and worsening orthostatic vitals with increase symptoms.  No chest pain or pressure .  No SOB/DOE and no PND/Orthopnea.  No weight gain or leg swelling. Had supine headaches but has a reclining bed and is usually at a 30 degree angle  Ambulatory blood pressure  Standing 99/54 heart rate 80 Sitting 139/64  78 Laying 149/71  80   Past Medical History:  Diagnosis Date   Depression with anxiety    GERD (gastroesophageal reflux disease)    Hyperplastic colon polyp    Melanoma ([place]) 2012   Mixed dyslipidemia    Tremor    Venous insufficiency of both lower extremities    Vitamin D deficiency     Past Surgical History:  Procedure Laterality Date   BUNIONECTOMY Left    CATARACT EXTRACTION Bilateral    MELANOMA EXCISION  2012    Current Medications: Current Meds  Medication Sig   atorvastatin (LIPITOR) 20 MG tablet Take 20 mg by mouth daily.    carbidopa-levodopa (SINEMET IR) 25-100 MG tablet Take 1 tablet by mouth  3 (three) times daily.   Cholecalciferol (VITAMIN D) 2000 units CAPS Take by mouth daily.   fludrocortisone (FLORINEF) 0.1 MG tablet Take 1 tablet (0.1 mg total) by mouth daily.   Multiple Vitamins-Minerals (WOMENS MULTIVITAMIN PO) Take by mouth daily.   Omega-3 Fatty Acids (FISH OIL) 1200 MG CAPS Take 1,200 mg by mouth daily.   sertraline (ZOLOFT) 50 MG tablet Take one tablet daily after breakfast.     Allergies:   Penicillins   Social History   Socioeconomic History   Marital status: Married    Spouse name: [name]   Number of children: 3   Years of education: Bachelors   Highest education level: Not on file  Occupational History   Occupation: Retired  Tobacco Use   Smoking status: Never   Smokeless tobacco: Never  Vaping Use   Vaping Use: Never used  Substance and Sexual Activity   Alcohol use: No   Drug use: No   Sexual activity: Not on file  Other Topics Concern   Not on file  Social History Narrative   Lives at home with husband.   Left-handed.   No caffeine use.   Social Determinants of Health   Financial Resource Strain: Not on file  Food Insecurity: Not on file  Transportation Needs: Not on file  Physical Activity: Not on file  Stress: Not on file  Social Connections: Not on file  Family History: The patient's family history includes Anemia in her father; Multiple myeloma in her mother. History of coronary artery disease notable for sister. History of heart failure notable for sister (needed valve replacement). History of arrhythmia notable for palpitations in her father. History of aortic aneurysm (sister) and dissection (mother).  ROS:   Please see the history of present illness.     All other systems reviewed and are negative.  EKGs/Labs/Other Studies Reviewed:    The following studies were reviewed today:  EKG:   02/08/21: SR 65 RAE, does not meet septal infarct criteria  Transthoracic Echocardiogram: Date: 03/18/21 Results:  1. Left  ventricular ejection fraction, by estimation, is 60 to 65%. The  left ventricle has normal function. The left ventricle has no regional  wall motion abnormalities. Left ventricular diastolic parameters are  consistent with Grade I diastolic  dysfunction (impaired relaxation).   2. Right ventricular systolic function is normal. The right ventricular  size is normal.   3. The mitral valve is normal in structure. No evidence of mitral valve  regurgitation. No evidence of mitral stenosis.   4. The aortic valve is normal in structure. Aortic valve regurgitation is  not visualized. No aortic stenosis is present.   5. The inferior vena cava is normal in size with greater than 50%  respiratory variability, suggesting right atrial pressure of 3 mmHg.    Recent Labs: 02/03/2021: TSH 2.939 08/07/2021: BUN 17; Creatinine, Ser 0.74; Hemoglobin 13.3; Platelets 208; Potassium 3.7; Sodium 135  Recent Lipid Panel No results found for: CHOL, TRIG, HDL, CHOLHDL, VLDL, LDLCALC, LDLDIRECT   Physical Exam:    VS:  BP 140/74   Pulse 73   Ht '5\' 7"'  (1.702 m)   Wt 137 lb 3.2 oz (62.2 kg)   SpO2 99%   BMI 21.49 kg/m     Orthostatic Vitals: Supine:  BP 130/77  HR 73 Sitting:  BP 135/77  HR 73 Standing:  BP 94/59  HR 76 (asymptomatic) Prolonged Stand:  BP 117/72  HR 81   Wt Readings from Last 3 Encounters:  08/23/21 137 lb 3.2 oz (62.2 kg)  08/07/21 140 lb (63.5 kg)  07/06/21 143 lb 3.2 oz (65 kg)    GEN:  Well nourished, well developed in no acute distress HEENT: Normal NECK: No JVD LYMPHATICS: No lymphadenopathy CARDIAC: RRR, no murmurs, rubs, gallops RESPIRATORY:  Clear to auscultation without rales, wheezing or rhonchi  ABDOMEN: Soft, non-tender, non-distended MUSCULOSKELETAL:  No edema; No deformity  SKIN: Warm and dry NEUROLOGIC:  Alert and oriented x 3 PSYCHIATRIC:  Slightly depressed affect   ASSESSMENT:    1. Parkinson's disease ([place])   2. Parkinson's disease with neurogenic  orthostatic hypotension ([facility])     PLAN:    In order of problems listed above:  Neurogenic Orthostatic Hypotension Parkinson's Disease -symptomatic - stress the importance of salt and water intake - gave education on slow rise, Valsalva maneuver exacerbation, temperature change - discussed muscle contraction and leg crossing - discussed elevation to 30-45 degrees when sleeping (they have a bed) - offered compression stockings (currently wearing) and abdominal binders - caffeine for post prandial symptoms with hydration - we will restart florinef 0.1 mg PO daily - if there are issues  with this we can switch to midodrine - will CC Dr. Krista Blue; we could switch to Droxidopa, thought neurologic monitoring would be needed from neurology side - heating abdominal pad for supine hypertension  Three month follow up   Medication Adjustments/Labs and  Tests Ordered: Current medicines are reviewed at length with the patient today.  Concerns regarding medicines are outlined above.  No orders of the defined types were placed in this encounter.  No orders of the defined types were placed in this encounter.   Patient Instructions  Medication Instructions:  Your physician recommends that you continue on your current medications as directed. Please refer to the Current Medication list given to you today.  *If you need a refill on your cardiac medications before your next appointment, please call your pharmacy*   Lab Work: NONE If you have labs (blood work) drawn today and your tests are completely normal, you will receive your results only by: [place] (if you have MyChart) OR A paper copy in the mail If you have any lab test that is abnormal or we need to change your treatment, we will call you to review the results.   Testing/Procedures: NONE   Follow-Up: At [facility], you and your health needs are our priority.  As part of our continuing mission to provide you with  exceptional heart care, we have created designated Provider Care Teams.  These Care Teams include your primary Cardiologist (physician) and Advanced Practice Providers (APPs -  Physician Assistants and Nurse Practitioners) who all work together to provide you with the care you need, when you need it.  We recommend signing up for the patient portal called "MyChart".  Sign up information is provided on this After Visit Summary.  MyChart is used to connect with patients for Virtual Visits (Telemedicine).  Patients are able to view lab/test results, encounter notes, upcoming appointments, etc.  Non-urgent messages can be sent to your provider as well.   To learn more about what you can do with MyChart, go to [email or web address].    Your next appointment:   3 month(s)  The format for your next appointment:   In Person  Provider:   You may see Werner Lean, MD or one of the following Advanced Practice Providers on your designated Care Team:   Melina Copa, PA-C Ermalinda Barrios, PA-C        Signed, Werner Lean, MD  08/23/2021 11:03 AM    [facility]

## 2021-08-23 ENCOUNTER — Encounter: Payer: Self-pay | Admitting: Internal Medicine

## 2021-08-23 ENCOUNTER — Ambulatory Visit: Payer: Medicare PPO | Admitting: Internal Medicine

## 2021-08-23 ENCOUNTER — Other Ambulatory Visit: Payer: Self-pay

## 2021-08-23 VITALS — BP 140/74 | HR 73 | Ht 67.0 in | Wt 137.2 lb

## 2021-08-23 DIAGNOSIS — G20 Parkinson's disease: Secondary | ICD-10-CM

## 2021-08-23 DIAGNOSIS — G903 Multi-system degeneration of the autonomic nervous system: Secondary | ICD-10-CM

## 2021-08-23 NOTE — Patient Instructions (Signed)
Medication Instructions:  Your physician recommends that you continue on your current medications as directed. Please refer to the Current Medication list given to you today.  *If you need a refill on your cardiac medications before your next appointment, please call your pharmacy*   Lab Work: NONE If you have labs (blood work) drawn today and your tests are completely normal, you will receive your results only by: [place] (if you have MyChart) OR A paper copy in the mail If you have any lab test that is abnormal or we need to change your treatment, we will call you to review the results.   Testing/Procedures: NONE   Follow-Up: At [facility], you and your health needs are our priority.  As part of our continuing mission to provide you with exceptional heart care, we have created designated Provider Care Teams.  These Care Teams include your primary Cardiologist (physician) and Advanced Practice Providers (APPs -  Physician Assistants and Nurse Practitioners) who all work together to provide you with the care you need, when you need it.  We recommend signing up for the patient portal called "MyChart".  Sign up information is provided on this After Visit Summary.  MyChart is used to connect with patients for Virtual Visits (Telemedicine).  Patients are able to view lab/test results, encounter notes, upcoming appointments, etc.  Non-urgent messages can be sent to your provider as well.   To learn more about what you can do with MyChart, go to [email or web address].    Your next appointment:   3 month(s)  The format for your next appointment:   In Person  Provider:   You may see Werner Lean, MD or one of the following Advanced Practice Providers on your designated Care Team:   Melina Copa, PA-C Ermalinda Barrios, PA-C

## 2021-09-01 ENCOUNTER — Telehealth: Payer: Self-pay | Admitting: Neurology

## 2021-09-01 ENCOUNTER — Telehealth: Payer: Self-pay | Admitting: Behavioral Health

## 2021-09-01 ENCOUNTER — Other Ambulatory Visit: Payer: Self-pay | Admitting: Behavioral Health

## 2021-09-01 DIAGNOSIS — F330 Major depressive disorder, recurrent, mild: Secondary | ICD-10-CM

## 2021-09-01 DIAGNOSIS — F411 Generalized anxiety disorder: Secondary | ICD-10-CM

## 2021-09-01 MED ORDER — SERTRALINE HCL 100 MG PO TABS: 100.0000 mg | ORAL_TABLET | Freq: Every day | ORAL | 3 refills | Status: DC

## 2021-09-01 NOTE — Telephone Encounter (Signed)
Not the wrong dose, she was to double up on 50 mg in order to not waste. New script for 100 mg sent to [facility].

## 2021-09-01 NOTE — Telephone Encounter (Signed)
Please review

## 2021-09-01 NOTE — Telephone Encounter (Signed)
Pt called states that the carbidopa-levodopa (SINEMET IR) 25-100 MG tablet is messing with her stomach more so making her nauseous. Pt wanting to know if there is anything she can take over the counter to relieve the nausea. Pt requesting a call back.

## 2021-09-01 NOTE — Telephone Encounter (Signed)
She started on Sinemet 25-'100mg'$ , one tab TID on 03/24/2020. I spoke to the patient who tells me she has developed nausea about one hour after eating meals. This symptom is off and on and just started recently (unable to remember how long but not as long as being on the medication). This is not likely related to her medication. I also spoke to Dr. Krista Blue who agreed. She has been instructed to contact her PCP if it continues to be bothersome.

## 2021-09-01 NOTE — Telephone Encounter (Signed)
Pt requesting new Rx for Sertraline 100 mg. Was taking 50 mg daily. 8/16 increased to 100 mg daily. Refills on Rx are for 50 mg. Wrong dose. [place]  apt 9/14. Will be out in a few days. Contact # [phone]

## 2021-09-02 NOTE — Telephone Encounter (Signed)
Pt notified Rx sent 

## 2021-09-08 ENCOUNTER — Ambulatory Visit: Payer: Medicare PPO | Admitting: Family Medicine

## 2021-09-08 ENCOUNTER — Other Ambulatory Visit: Payer: Self-pay

## 2021-09-08 ENCOUNTER — Ambulatory Visit (INDEPENDENT_AMBULATORY_CARE_PROVIDER_SITE_OTHER): Payer: Medicare PPO | Admitting: Behavioral Health

## 2021-09-08 ENCOUNTER — Encounter: Payer: Self-pay | Admitting: Behavioral Health

## 2021-09-08 DIAGNOSIS — F330 Major depressive disorder, recurrent, mild: Secondary | ICD-10-CM | POA: Diagnosis not present

## 2021-09-08 DIAGNOSIS — F411 Generalized anxiety disorder: Secondary | ICD-10-CM | POA: Diagnosis not present

## 2021-09-08 NOTE — Progress Notes (Signed)
[facility]  Patient ID: Desiree Allen,  MRN: [id number]  PCP: [facility] @ [place]  Date of Evaluation: 09/08/2021 Time spent:30 minutes  Chief Complaint:  Chief Complaint   Anxiety; Depression; Follow-up; Memory Loss     HISTORY/CURRENT STATUS: HPI  Desiree Allen presents for follow-up and medication management with refills provided. She says that she fully weaned off Paxil and has been taking Zoloft 100 mg for only one week now. She says she feels like depression and anxiety have improved since last visit. There is still noticeable decline in verbal expression.  She is alert and oriented x 3 this visit. She says her neurologist said she could stop taking her evening dose of Carbidopa-Levodopa and she feels like she has seen some improvement with "fuzzy thinking". She says her anxiety and depression has improved to what she calls a manageable level. She reports anxiety 3/10 and depression at 3/10. Says that she is sleeping 7 hours per night. No mania, no psychosis. Some short term memory loss as expected with advanced age. No SI/HI.    Past  psychiatric medications Prozac Paxil       Individual Medical History/ Review of Systems: Changes? :No   Allergies: Penicillins  Current Medications:  Current Outpatient Medications:    atorvastatin (LIPITOR) 20 MG tablet, Take 20 mg by mouth daily. , Disp: , Rfl:    carbidopa-levodopa (SINEMET IR) 25-100 MG tablet, Take 1 tablet by mouth 3 (three) times daily., Disp: 270 tablet, Rfl: 4   Cholecalciferol (VITAMIN D) 2000 units CAPS, Take by mouth daily., Disp: , Rfl:    fludrocortisone (FLORINEF) 0.1 MG tablet, Take 1 tablet (0.1 mg total) by mouth daily., Disp: 90 tablet, Rfl: 3   Multiple Vitamins-Minerals (WOMENS MULTIVITAMIN PO), Take by mouth daily., Disp: , Rfl:    Omega-3 Fatty Acids (FISH OIL) 1200 MG CAPS, Take 1,200 mg by mouth daily., Disp: , Rfl:    sertraline (ZOLOFT) 100 MG tablet, Take 1  tablet (100 mg total) by mouth daily., Disp: 30 tablet, Rfl: 3 Medication Side Effects: anxiety  Family Medical/ Social History: Changes? No  MENTAL HEALTH EXAM:  There were no vitals taken for this visit.There is no height or weight on file to calculate BMI.  General Appearance: Casual and Neat  Eye Contact:  Good  Speech:  Clear and Coherent  Volume:  Normal  Mood:  Anxious and Depressed  Affect:  Depressed, Flat, and Anxious  Thought Process:  Disorganized  Orientation:  Full (Time, Place, and Person)  Thought Content: Logical   Suicidal Thoughts:  No  Homicidal Thoughts:  No  Memory:  WNL  Judgement:  Good  Insight:  Good  Psychomotor Activity:  Decreased  Concentration:  Concentration: Good  Recall:  [place] of Knowledge: Fair  Language: Fair  Assets:  Desire for Improvement Physical Health Resilience  ADL's:  Impaired  Cognition: Impaired,  Mild  Prognosis:  Fair    DIAGNOSES:    ICD-10-CM   1. Generalized anxiety disorder  F41.1     2. Mild episode of recurrent major depressive disorder (HCC)  F33.0       Receiving Psychotherapy: No    RECOMMENDATIONS:   Greater than 50% of  30 min face to face time with patient was spent on counseling and coordination of care. We discussed her current depression and anxiety levels and and improvement self-reported to be manageable level. She appears to have some mild level of Anomic Apshasia.  However she did score 29 on MME. She does have some short term memory impairment which is to be expected due to advanced age and Parkinson's disease. Pt is experiencing moderate improvement with anxiety and depression since last visit.  Increased Zoloft 50 mg daily to 100 mg daily via telephone call in. She has not been on increased dosage but one week.  Will report worsening symptoms or side effects promptly Agreed to come back in 4 weeks to reassess. Provided emergency contact information. Advised pt that it was ok, if she  wanted to bring husband in next visit  to help explain condition.       Elwanda Brooklyn, NP

## 2021-09-21 ENCOUNTER — Telehealth: Payer: Self-pay | Admitting: Neurology

## 2021-09-21 DIAGNOSIS — M545 Low back pain, unspecified: Secondary | ICD-10-CM

## 2021-09-21 DIAGNOSIS — G20 Parkinson's disease: Secondary | ICD-10-CM

## 2021-09-21 NOTE — Telephone Encounter (Signed)
Pt called states she is wanting a referral for PT. She would like to see Dr. Weston Anna and she provided a fax number [phone].

## 2021-09-22 DIAGNOSIS — M545 Low back pain, unspecified: Secondary | ICD-10-CM | POA: Insufficient documentation

## 2021-09-22 NOTE — Addendum Note (Signed)
Addended by: Marcial Pacas on: 09/22/2021 10:40 AM   Modules accepted: Orders

## 2021-09-22 NOTE — Telephone Encounter (Signed)
Orders Placed This Encounter  Procedures   Ambulatory referral to Physical Therapy   Ambulatory referral to Orthopedic Surgery

## 2021-09-23 ENCOUNTER — Telehealth: Payer: Self-pay | Admitting: Behavioral Health

## 2021-09-23 ENCOUNTER — Telehealth: Payer: Self-pay | Admitting: Neurology

## 2021-09-23 NOTE — Telephone Encounter (Signed)
Please call her and advise that it has not been long enough yet to determine. It has only been 3 weeks. Tell her to give it another week or so and if it is not working better to call back and I will increase the dose. Thanks

## 2021-09-23 NOTE — Telephone Encounter (Signed)
Please review

## 2021-09-23 NOTE — Telephone Encounter (Signed)
Pt informed

## 2021-09-23 NOTE — Telephone Encounter (Signed)
Sent to [facility] ph # [phone].

## 2021-09-23 NOTE — Telephone Encounter (Signed)
Pt called and LVM saying she didn't think the Zoloft was working.  She feels like she needs to increase it.  Her next appt isn't until 10/20 and she wanted to know what you thought she should do.  I called her and told her you had some openings before 10/20 now, so if you want her to come back in before then we can schedule something, but she would like to know you thoughts about the increase of the medication instead of coming back in for now.

## 2021-09-27 DIAGNOSIS — M545 Low back pain, unspecified: Secondary | ICD-10-CM | POA: Diagnosis not present

## 2021-09-30 DIAGNOSIS — H40053 Ocular hypertension, bilateral: Secondary | ICD-10-CM | POA: Diagnosis not present

## 2021-09-30 DIAGNOSIS — H40013 Open angle with borderline findings, low risk, bilateral: Secondary | ICD-10-CM | POA: Diagnosis not present

## 2021-10-04 ENCOUNTER — Telehealth: Payer: Self-pay | Admitting: Psychiatry

## 2021-10-04 NOTE — Telephone Encounter (Signed)
Desiree Allen called . Has been on Zoloft 100mg  and she does not feel as if she is taking anything. Is off of Paxil as of Aug 11. She is having anxiety, wakes up at night and panics and can't sleep.

## 2021-10-05 NOTE — Telephone Encounter (Signed)
Desiree Allen has a difficult time with this patient.He has made several changes to her medications and she feels as if they still don't work.

## 2021-10-05 NOTE — Telephone Encounter (Signed)
Reviewed B white NP's last note.  Ok to increase sertraline to 150mg  for anxiety

## 2021-10-06 NOTE — Telephone Encounter (Signed)
Pt informed

## 2021-10-07 IMAGING — MR MR LUMBAR SPINE W/O CM
4 of 5 series · 24 of 48 positions shown · non-contrast
Comparison: Lumbar radiographs 02/07/2020. CT Abdomen and Pelvis
02/06/2021.

CLINICAL DATA: 75-year-old female with right side low back pain for
4 weeks.

EXAM:
MRI LUMBAR SPINE WITHOUT CONTRAST
TECHNIQUE: Multiplanar, multisequence MR imaging of the lumbar spine was
performed. No intravenous contrast was administered.

[Series 2: T2 · sagittal · 4.0mm · 0.53mm/px · 6 of 18 slices shown (1 of 2)]
[im 1/18]
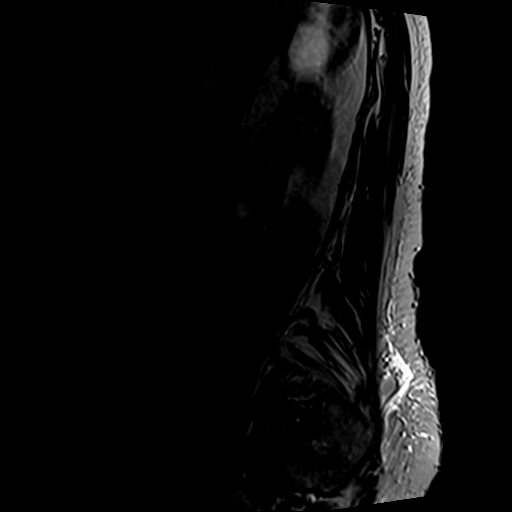
[im 4/18]
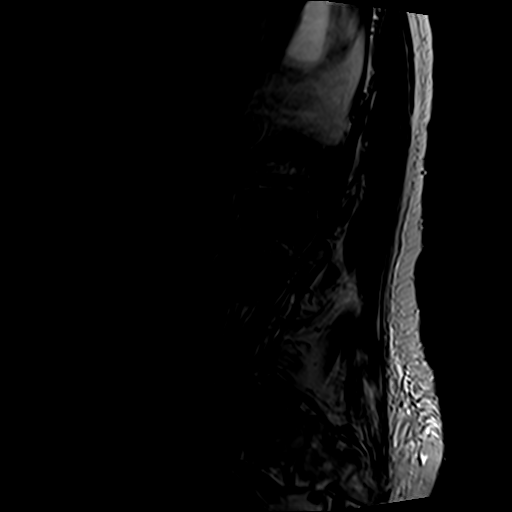
[im 7/18]
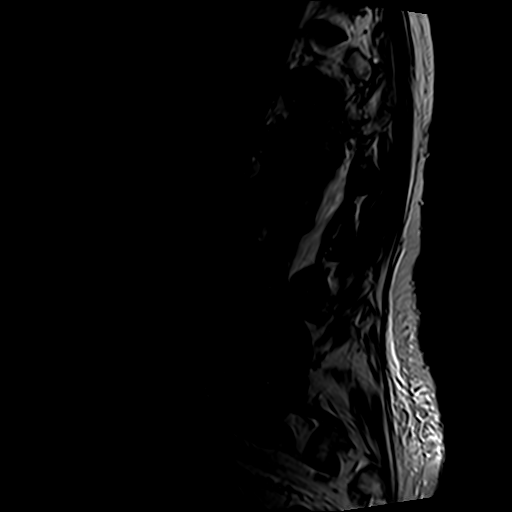
[im 11/18]
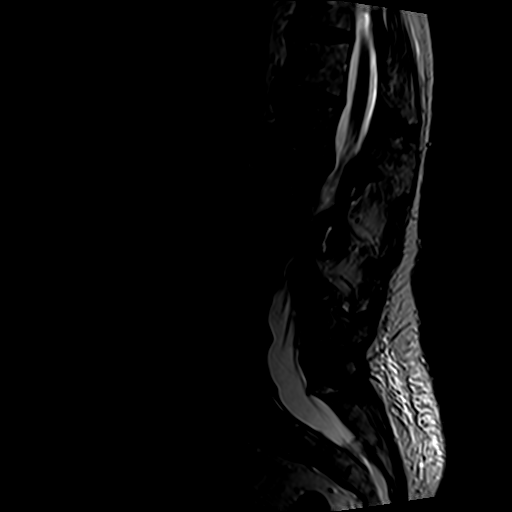
[im 14/18]
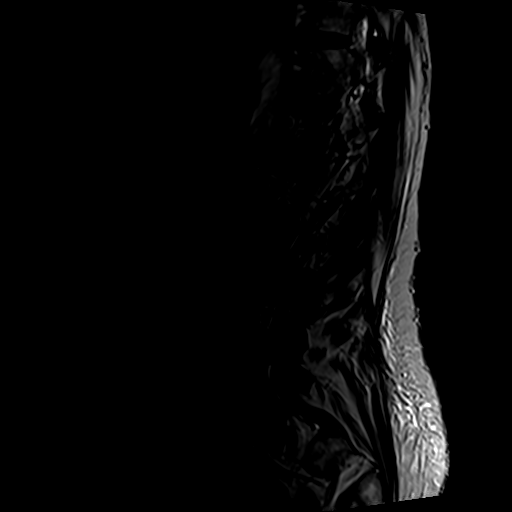
[im 18/18]
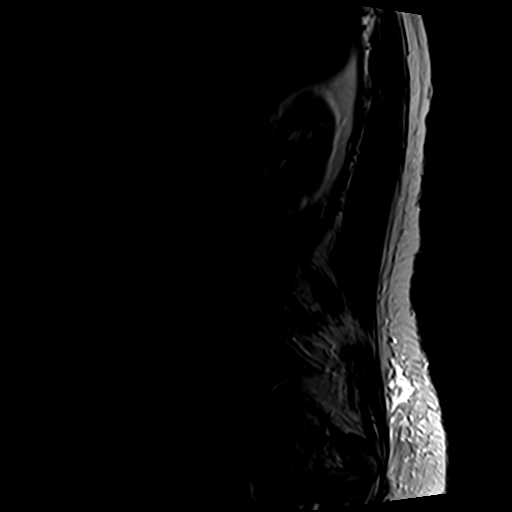

[Series 4: T1 · sagittal · 4.0mm · 0.53mm/px · 7 of 18 slices shown (1 of 2)]
[im 1/18]
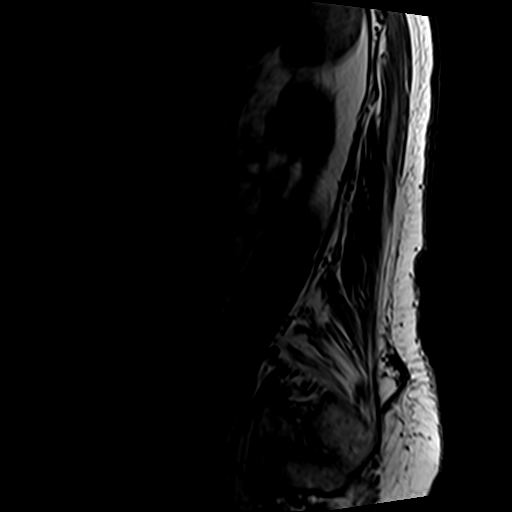
[im 3/18]
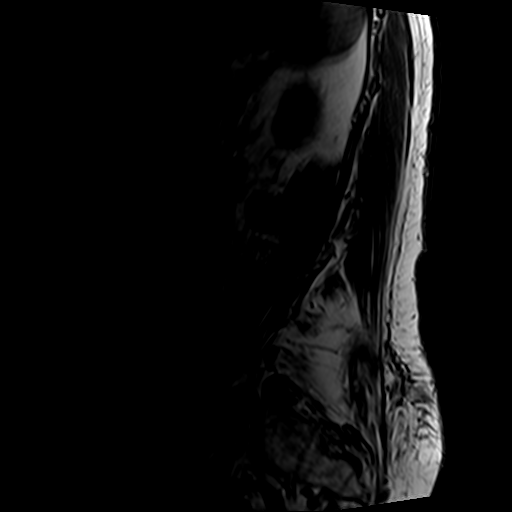
[im 6/18]
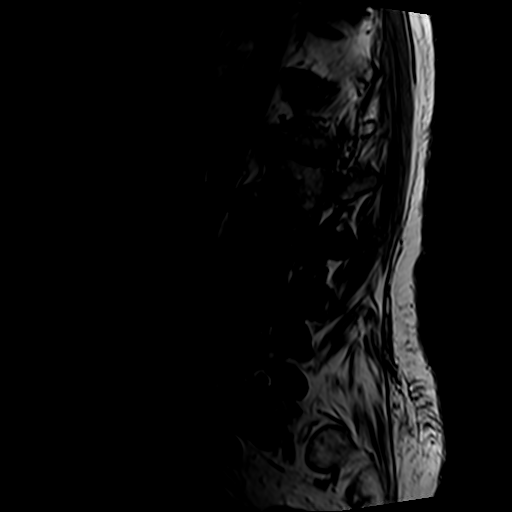
[im 9/18]
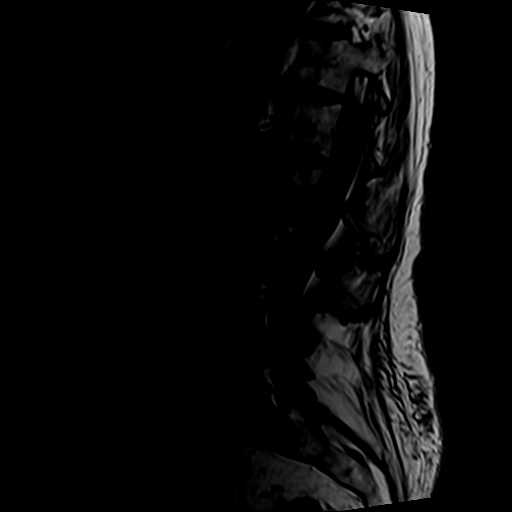
[im 12/18]
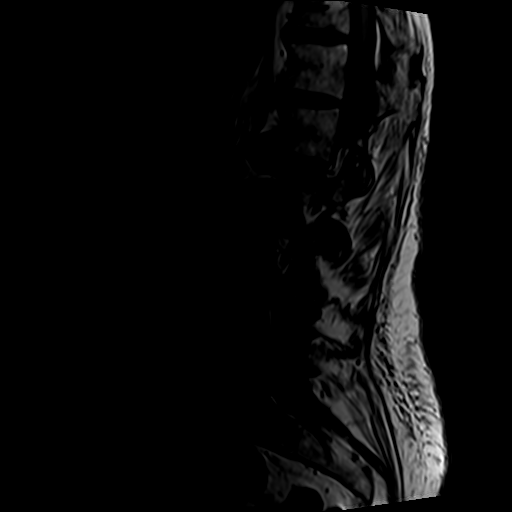
[im 15/18]
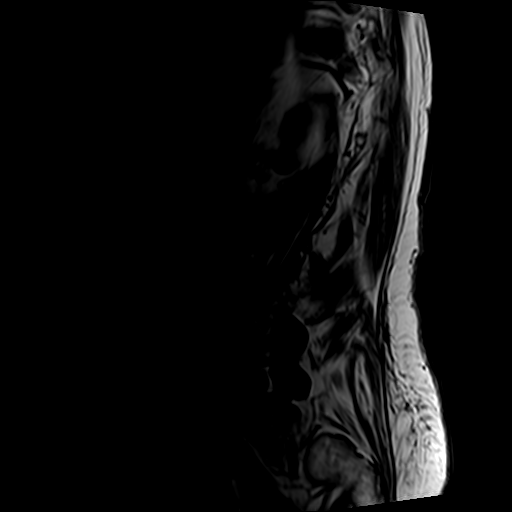
[im 18/18]
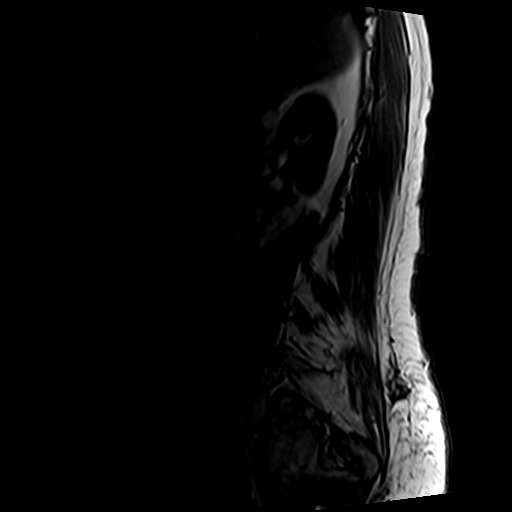

[Series 5: T2 · axial · 4.0mm · 0.70mm/px · z∈[-64,+140]mm · 8 of 38 slices shown (2 of 2)]
[im 1/38]
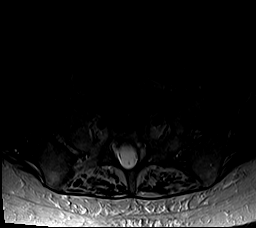
[im 6/38]
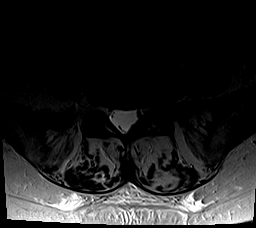
[im 12/38]
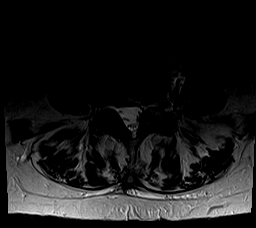
[im 18/38]
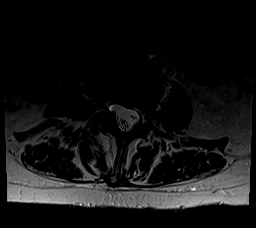
[im 20/38]
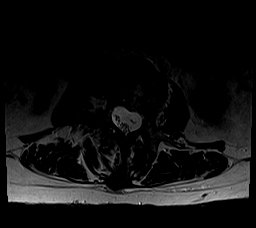
[im 26/38]
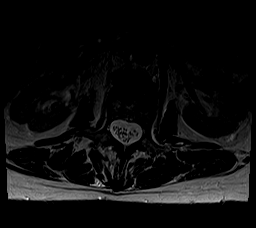
[im 32/38]
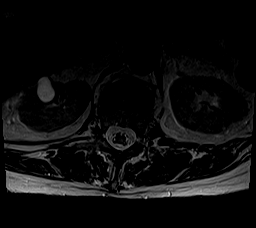
[im 38/38]
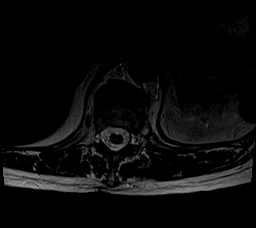

[Series 6: T1 · axial · 4.0mm · 0.35mm/px · z∈[-38,+109]mm · 3 of 38 slices shown (2 of 2)]
[im 6/38]
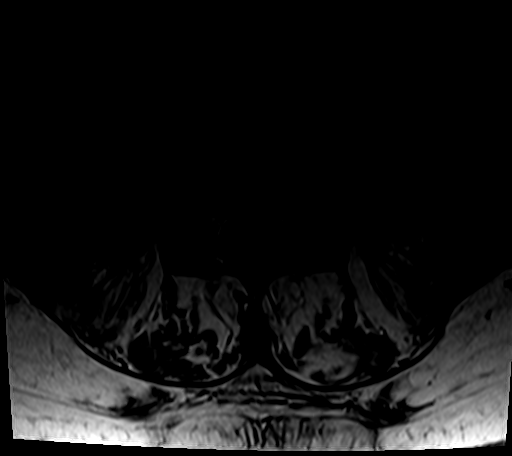
[im 20/38]
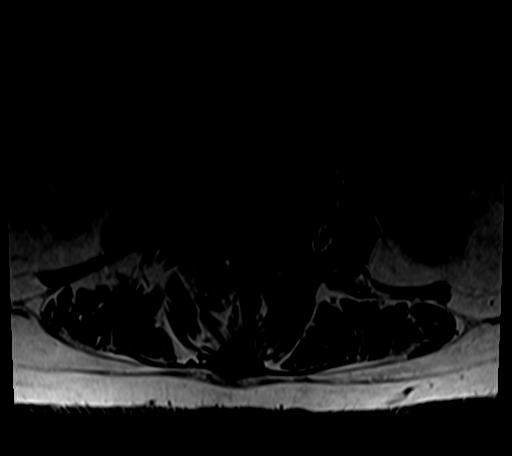
[im 32/38]
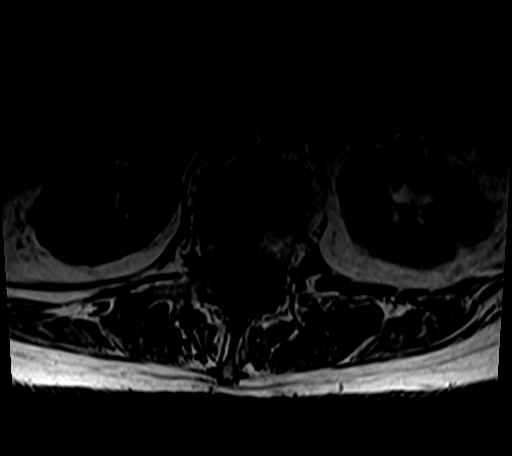

[24 of 48 positions shown; findings below may reference images not displayed]

FINDINGS: Segmentation:  Normal on the comparison is.

Alignment: Stable since last year. Moderate levoconvex lumbar
scoliosis is associated with straightening of lumbar lordosis and
mild retrolisthesis of L2 on L3 and L5 on S1.

Vertebrae: Faint degenerative marrow edema in the right lateral
L2-L3 endplates and bulky osteophytes at that level. Chronic
degenerative endplate marrow signal changes elsewhere. Normal
background bone marrow signal. Intact visible sacrum and SI joints.

Conus medullaris and cauda equina: Conus extends to the L1-L2 level.
No lower spinal cord or conus signal abnormality. Capacious spinal
canal at most levels.

Paraspinal and other soft tissues: Stable since [REDACTED], negative.

Disc levels:

T11-T12: Negative.

T12-L1:  Negative.

L1-L2: Anterior and right eccentric disc osteophyte complex. Mild
facet hypertrophy greater on the right. No stenosis.

L2-L3: Mild retrolisthesis. Right lateral eccentric circumferential
disc osteophyte complex with bulky right far lateral osteophytes.
Mild facet hypertrophy. Mild right lateral recess stenosis (right L3
nerve level). No convincing right foraminal stenosis. No spinal
stenosis.

L3-L4: Right eccentric and far lateral circumferential disc
osteophyte complex. Mild to moderate facet and ligament flavum
hypertrophy. No significant spinal stenosis. No convincing lateral
recess stenosis. Mild left and mild to moderate right L3 foraminal
stenosis.

L4-L5: Disc space loss with circumferential disc osteophyte complex,
more eccentric to the left. Mild facet hypertrophy greater on the
left. No spinal or convincing lateral recess stenosis. Mild to
moderate left L4 foraminal stenosis.

L5-S1: Circumferential but mostly left far lateral disc osteophyte
complex with mild facet hypertrophy greater on the left. No spinal
or lateral recess stenosis. Moderate left L5 foraminal stenosis.
IMPRESSION: 1. Moderate scoliosis with mild degenerative appearing marrow edema
in bulky right far lateral endplate osteophytes at L2-L3. No other
acute osseous abnormality.

2. Chronic disc, endplate, and posterior element degeneration
elsewhere but no significant spinal stenosis. Occasional mild
lateral recess stenosis. Moderate neural foraminal stenosis at the
right L3, left L4, and left L5 nerve levels.

## 2021-10-11 ENCOUNTER — Ambulatory Visit (HOSPITAL_BASED_OUTPATIENT_CLINIC_OR_DEPARTMENT_OTHER): Payer: Medicare PPO | Attending: Neurology | Admitting: Physical Therapy

## 2021-10-11 ENCOUNTER — Ambulatory Visit: Payer: Medicare PPO | Admitting: Behavioral Health

## 2021-10-13 NOTE — Telephone Encounter (Signed)
Reviewed and noted increase in Sertraline 150 mg  daily for anxiety.

## 2021-10-14 ENCOUNTER — Ambulatory Visit (INDEPENDENT_AMBULATORY_CARE_PROVIDER_SITE_OTHER): Payer: Medicare PPO | Admitting: Behavioral Health

## 2021-10-14 ENCOUNTER — Other Ambulatory Visit: Payer: Self-pay

## 2021-10-14 ENCOUNTER — Encounter: Payer: Self-pay | Admitting: Behavioral Health

## 2021-10-14 DIAGNOSIS — F330 Major depressive disorder, recurrent, mild: Secondary | ICD-10-CM

## 2021-10-14 DIAGNOSIS — F411 Generalized anxiety disorder: Secondary | ICD-10-CM

## 2021-10-14 MED ORDER — SERTRALINE HCL 100 MG PO TABS: 150.0000 mg | ORAL_TABLET | Freq: Every day | ORAL | 3 refills | Status: DC

## 2021-10-14 NOTE — Progress Notes (Signed)
[facility]  Patient ID: OPHA MCGHEE,  MRN: [id number]  PCP: [facility] [place]  Date of Evaluation: 10/14/2021 Time spent:30 minutes  Chief Complaint:  Chief Complaint   Anxiety; Depression; Follow-up; Medication Problem; Medication Refill     HISTORY/CURRENT STATUS: HPI Grissel A Dozier presents for follow-up and medication management.  She says she feels like depression and anxiety have improved overall since last visit but she has had periods where anxiety and depression felt overwhelming and she did not think medication was working. Her husband is present during the interview with her consent. He says that he has seen improvement since increase in Zoloft. She called in to office last week while I was out of office feeling like her medication was not working. Dr. Clovis Pu increased her Zoloft to 150 mg daily. There is still noticeable decline in verbal expression.  She is alert and oriented x 3 this visit. She says her neurologist said she could stop taking her evening dose of Carbidopa-Levodopa and she feels like she has seen some improvement with "fuzzy thinking". She says her anxiety and depression has improved to what she calls a manageable level. She reports anxiety 3/10 and depression at 3/10. Says that she is sleeping 7 hours per night. No mania, no psychosis. Some short term memory loss as expected with advanced age. No SI/HI.    Past  psychiatric medications Prozac Paxil   Individual Medical History/ Review of Systems: Changes? :No   Allergies: Penicillins  Current Medications:  Current Outpatient Medications:    atorvastatin (LIPITOR) 20 MG tablet, Take 20 mg by mouth daily. , Disp: , Rfl:    carbidopa-levodopa (SINEMET IR) 25-100 MG tablet, Take 1 tablet by mouth 3 (three) times daily., Disp: 270 tablet, Rfl: 4   Cholecalciferol (VITAMIN D) 2000 units CAPS, Take by mouth daily., Disp: , Rfl:    fludrocortisone (FLORINEF) 0.1 MG tablet,  Take 1 tablet (0.1 mg total) by mouth daily., Disp: 90 tablet, Rfl: 3   Multiple Vitamins-Minerals (WOMENS MULTIVITAMIN PO), Take by mouth daily., Disp: , Rfl:    Omega-3 Fatty Acids (FISH OIL) 1200 MG CAPS, Take 1,200 mg by mouth daily., Disp: , Rfl:    sertraline (ZOLOFT) 100 MG tablet, Take 1.5 tablets (150 mg total) by mouth daily., Disp: 45 tablet, Rfl: 3 Medication Side Effects: none  Family Medical/ Social History: Changes? No  MENTAL HEALTH EXAM:  There were no vitals taken for this visit.There is no height or weight on file to calculate BMI.  General Appearance: Casual  Eye Contact:  Good  Speech:  Clear and Coherent  Volume:  Normal  Mood:  Anxious and Depressed  Affect:  Appropriate  Thought Process:  Coherent  Orientation:  Full (Time, Place, and Person)  Thought Content: Logical   Suicidal Thoughts:  No  Homicidal Thoughts:  No  Memory:  WNL  Judgement:  Good  Insight:  Good  Psychomotor Activity:  Normal  Concentration:  Concentration: Good  Recall:  Good  Fund of Knowledge: Good  Language: Good  Assets:  Desire for Improvement  ADL's:  Intact  Cognition: WNL  Prognosis:  Good    DIAGNOSES:    ICD-10-CM   1. Generalized anxiety disorder  F41.1 sertraline (ZOLOFT) 100 MG tablet    2. Mild episode of recurrent major depressive disorder (HCC)  F33.0 sertraline (ZOLOFT) 100 MG tablet      Receiving Psychotherapy: No    RECOMMENDATIONS:   Greater than 50% of  30 min face to face time with patient was spent on counseling and coordination of care. We discussed her current depression and anxiety levels and and improvement self-reported to be manageable level. She appears to have some mild level of Anomic Apshasia.  However she did score 29 on MME. She does have some short term memory impairment which is to be expected due to advanced age and Parkinson's disease. Pt is experiencing moderate improvement with anxiety and depression since last visit. Patients  husband was present this visit. Educated him on patients condition and prognosis.   Increased Zoloft 100 mg daily to 150 mg daily via telephone call in. She started increased Zoloft on 10/04/2021. She has not been on increased dosage but one week. No changes this visit and will follow up in 6 weeks to reevaluate.  Provided emergency contact information. Advised pt that it was ok, if she wanted to bring husband in next visit  to help explain condition      Elwanda Brooklyn, NP

## 2021-10-27 DIAGNOSIS — N1831 Chronic kidney disease, stage 3a: Secondary | ICD-10-CM | POA: Diagnosis not present

## 2021-10-27 DIAGNOSIS — E782 Mixed hyperlipidemia: Secondary | ICD-10-CM | POA: Diagnosis not present

## 2021-11-03 DIAGNOSIS — Z0000 Encounter for general adult medical examination without abnormal findings: Secondary | ICD-10-CM | POA: Diagnosis not present

## 2021-11-25 ENCOUNTER — Other Ambulatory Visit: Payer: Self-pay

## 2021-11-25 ENCOUNTER — Encounter: Payer: Self-pay | Admitting: Behavioral Health

## 2021-11-25 ENCOUNTER — Ambulatory Visit: Payer: Medicare PPO | Admitting: Behavioral Health

## 2021-11-25 DIAGNOSIS — F330 Major depressive disorder, recurrent, mild: Secondary | ICD-10-CM | POA: Diagnosis not present

## 2021-11-25 DIAGNOSIS — F411 Generalized anxiety disorder: Secondary | ICD-10-CM

## 2021-11-25 NOTE — Progress Notes (Signed)
[facility]  Patient ID: [name],  MRN: [id number]  PCP: [facility] @ [place]  Date of Evaluation: 11/25/2021 Time spent:20 minutes  Chief Complaint:  Chief Complaint   Anxiety; Follow-up; Medication Refill     HISTORY/CURRENT STATUS: HPI Desiree Allen for follow-up and medication management. She says that she is doing very well since last visit. Feels like perceived problems are not so big anymore. Her  husband says that he has seen improvement since increase in Zoloft. She does not want to make any medication adjustments at this time. She says her anxiety and depression has improved to what she calls a manageable level. She reports anxiety 3/10 and depression at 2/10. Says that she is sleeping 7 hours per night. No mania, no psychosis. Some short term memory loss as expected with advanced age. No SI/HI.    Past  psychiatric medications Prozac Paxil   Individual Medical History/ Review of Systems: Changes? :No   Allergies: Penicillins  Current Medications:  Current Outpatient Medications:    atorvastatin (LIPITOR) 20 MG tablet, Take 20 mg by mouth daily. , Disp: , Rfl:    carbidopa-levodopa (SINEMET IR) 25-100 MG tablet, Take 1 tablet by mouth 3 (three) times daily., Disp: 270 tablet, Rfl: 4   Cholecalciferol (VITAMIN D) 2000 units CAPS, Take by mouth daily., Disp: , Rfl:    fludrocortisone (FLORINEF) 0.1 MG tablet, Take 1 tablet (0.1 mg total) by mouth daily., Disp: 90 tablet, Rfl: 3   Multiple Vitamins-Minerals (WOMENS MULTIVITAMIN PO), Take by mouth daily., Disp: , Rfl:    Omega-3 Fatty Acids (FISH OIL) 1200 MG CAPS, Take 1,200 mg by mouth daily., Disp: , Rfl:    sertraline (ZOLOFT) 100 MG tablet, Take 1.5 tablets (150 mg total) by mouth daily., Disp: 45 tablet, Rfl: 3 Medication Side Effects: none  Family Medical/ Social History: Changes? No  MENTAL HEALTH EXAM:  There were no vitals taken for this visit.There is no  height or weight on file to calculate BMI.  General Appearance: Casual, Neat, and Well Groomed  Eye Contact:  Good  Speech:  Clear and Coherent  Volume:  Normal  Mood:  Anxious  Affect:  Appropriate  Thought Process:  Coherent  Orientation:  Full (Time, Place, and Person)  Thought Content: Logical   Suicidal Thoughts:  No  Homicidal Thoughts:  No  Memory:  WNL  Judgement:  Good  Insight:  Good  Psychomotor Activity:  Normal  Concentration:  Concentration: Good  Recall:  Good  Fund of Knowledge: Good  Language: Good  Assets:  Desire for Improvement  ADL's:  Intact  Cognition: WNL  Prognosis:  Good    DIAGNOSES:    ICD-10-CM   1. Generalized anxiety disorder  F41.1     2. Mild episode of recurrent major depressive disorder (HCC)  F33.0       Receiving Psychotherapy: No    RECOMMENDATIONS:   Greater than 50% of  30 min face to face time with patient was spent on counseling and coordination of care. We discussed her current depression and anxiety levels and and improvement self-reported to be manageable level.  She is smiling this visit and appears to be more stabile.   Increased Zoloft 100 mg daily to 150 mg daily via telephone call in. She started increased Zoloft on 10/04/2021.  No changes this visit and will follow up in 8 weeks to reevaluate.  Provided emergency contact information. Reviewed PDMD  Elwanda Brooklyn, NP

## 2021-12-06 NOTE — Progress Notes (Signed)
Cardiology Office Note:    Date:  12/07/2021   ID:  Desiree Allen, DOB Dec 22, 1946, MRN [id number]  PCP:  [facility] @ [place]  Cardiologist:  Werner Lean, MD  Advanced Practice Provider:  No care team member to display Electrophysiologist:  None      CC: Orthostatic Hypotension in the setting of Parkinson's Disease Follow up  History of Present Illness:    Desiree Allen is a 75 y.o. female with a hx of Orthostatic Hypotension and Parkinson's Disease who presents for evaluation 02/18/21. In interim of this visit, patient was unable to get droxidopa and was started on florinef.  Patient was unable to reach PCP about hospital bed and we assisted with this one time (patient deferred in the setting of a used hospital bed). Had recent return of symptoms prompting urgent visit 08/23/21. Her symptoms worsened when this was reduced and we restarted the medication.  Seen 12/07/21.  Patient notes that she is doing fine.   Since day prior/last visit notes  no issues since she is back on . There are no interval hospital/ED visit.    No chest pain or pressure.  No SOB/DOE and no PND/Orthopnea.  No weight gain or leg swelling.  No palpitations or syncope.   Past Medical History:  Diagnosis Date   Depression with anxiety    GERD (gastroesophageal reflux disease)    Hyperplastic colon polyp    Melanoma ([place]) 2012   Mixed dyslipidemia    Tremor    Venous insufficiency of both lower extremities    Vitamin D deficiency     Past Surgical History:  Procedure Laterality Date   BUNIONECTOMY Left    CATARACT EXTRACTION Bilateral    MELANOMA EXCISION  2012    Current Medications: Current Meds  Medication Sig   atorvastatin (LIPITOR) 20 MG tablet Take 20 mg by mouth daily.    carbidopa-levodopa (SINEMET IR) 25-100 MG tablet Take 1 tablet by mouth 3 (three) times daily.   Cholecalciferol (VITAMIN D) 2000 units CAPS Take by  mouth daily.   fludrocortisone (FLORINEF) 0.1 MG tablet Take 1 tablet (0.1 mg total) by mouth daily.   Multiple Vitamins-Minerals (WOMENS MULTIVITAMIN PO) Take by mouth daily.   Omega-3 Fatty Acids (FISH OIL) 1200 MG CAPS Take 1,200 mg by mouth daily.   sertraline (ZOLOFT) 100 MG tablet Take 1.5 tablets (150 mg total) by mouth daily.     Allergies:   Penicillins   Social History   Socioeconomic History   Marital status: Married    Spouse name: [name]   Number of children: 3   Years of education: Bachelors   Highest education level: Not on file  Occupational History   Occupation: Retired  Tobacco Use   Smoking status: Never   Smokeless tobacco: Never  Vaping Use   Vaping Use: Never used  Substance and Sexual Activity   Alcohol use: No   Drug use: No   Sexual activity: Not on file  Other Topics Concern   Not on file  Social History Narrative   Lives at home with husband.   Left-handed.   No caffeine use.   Social Determinants of Health   Financial Resource Strain: Not on file  Food Insecurity: Not on file  Transportation Needs: Not on file  Physical Activity: Not on file  Stress: Not on file  Social Connections: Not on file    Social: Yolanda Bonine is getting married then  moving to [facility]; other family is in [place]  Family History: The patient's family history includes Anemia in her father; Multiple myeloma in her mother. History of coronary artery disease notable for sister. History of heart failure notable for sister (needed valve replacement). History of arrhythmia notable for palpitations in her father. History of aortic aneurysm (sister) and dissection (mother).  ROS:   Please see the history of present illness.     All other systems reviewed and are negative.  EKGs/Labs/Other Studies Reviewed:    The following studies were reviewed today:  EKG:   02/08/21: SR 71 RAE, does not meet septal infarct criteria  Transthoracic Echocardiogram: Date:  03/18/21 Results:  1. Left ventricular ejection fraction, by estimation, is 60 to 65%. The  left ventricle has normal function. The left ventricle has no regional  wall motion abnormalities. Left ventricular diastolic parameters are  consistent with Grade I diastolic  dysfunction (impaired relaxation).   2. Right ventricular systolic function is normal. The right ventricular  size is normal.   3. The mitral valve is normal in structure. No evidence of mitral valve  regurgitation. No evidence of mitral stenosis.   4. The aortic valve is normal in structure. Aortic valve regurgitation is  not visualized. No aortic stenosis is present.   5. The inferior vena cava is normal in size with greater than 50%  respiratory variability, suggesting right atrial pressure of 3 mmHg.    Recent Labs: 02/03/2021: TSH 2.939 08/07/2021: BUN 17; Creatinine, Ser 0.74; Hemoglobin 13.3; Platelets 208; Potassium 3.7; Sodium 135  Recent Lipid Panel No results found for: CHOL, TRIG, HDL, CHOLHDL, VLDL, LDLCALC, LDLDIRECT   Physical Exam:    VS:  BP 132/64   Pulse 76   Ht '5\' 7"'  (1.702 m)   Wt 136 lb (61.7 kg)   SpO2 94%   BMI 21.30 kg/m     Orthostatic Vitals: Supine:  BP 148/80  HR 83 Sitting:  BP 144/72  HR 71 Standing:  BP 115/60  HR 74 Prolonged Stand:  BP 107/67  HR 76  Wt Readings from Last 3 Encounters:  12/07/21 136 lb (61.7 kg)  08/23/21 137 lb 3.2 oz (62.2 kg)  08/07/21 140 lb (63.5 kg)    Gen: No  distress, Elderly female Neck: No JVD Cardiac: No Rubs or Gallops, no Murmur, regular rhythm  +2 radial pulses Respiratory: Clear to auscultation bilaterally, normal effort, normal  respiratory rate GI: Soft, nontender, non-distended  MS: No  edema;  moves all extremities Integument: Skin feels warm Neuro:  At time of evaluation, alert and oriented to person/place/time/situation with slight resting tremor hands Psych: Normal affect, patient feels OK (she thought she lost her bank care, but  ended up finding it.    ASSESSMENT:    1. Parkinson's disease with neurogenic orthostatic hypotension ([place])   2. Parkinson's disease ([place])     PLAN:    In order of problems listed above:  Neurogenic Orthostatic Hypotension Parkinson's Disease HLD -asymptomatic - stress the importance of salt and water intake - gave education on slow rise, Valsalva maneuver exacerbation, temperature change (this winter) - discussed elevation to 30-45 degrees when sleeping  - offered compression stockings and abdominal binders - discussed caffeine for post prandial symptoms with hydration - florinef 0.1 mg PO daily; we have discussed the natural progression of this disease and that she may need an higher dose in the future - if there are issues  with this we can switch to midodrine -  I do not suspect we will be able to start droxidopa without close collaboration with neurology - heating abdominal pad for supine hypertension - continue atorvastatin 20 mg   Three month follow up   Medication Adjustments/Labs and Tests Ordered: Current medicines are reviewed at length with the patient today.  Concerns regarding medicines are outlined above.  No orders of the defined types were placed in this encounter.  No orders of the defined types were placed in this encounter.   Patient Instructions  Medication Instructions:  Your physician recommends that you continue on your current medications as directed. Please refer to the Current Medication list given to you today.  *If you need a refill on your cardiac medications before your next appointment, please call your pharmacy*   Lab Work: NONE If you have labs (blood work) drawn today and your tests are completely normal, you will receive your results only by: [place] (if you have MyChart) OR A paper copy in the mail If you have any lab test that is abnormal or we need to change your treatment, we will call you to review the  results.   Testing/Procedures: NONE   Follow-Up: At [facility], you and your health needs are our priority.  As part of our continuing mission to provide you with exceptional heart care, we have created designated Provider Care Teams.  These Care Teams include your primary Cardiologist (physician) and Advanced Practice Providers (APPs -  Physician Assistants and Nurse Practitioners) who all work together to provide you with the care you need, when you need it.   Your next appointment:   6 month(s)  The format for your next appointment:   In Person  Provider:   Werner Lean, MD         Signed, Werner Lean, MD  12/07/2021 3:00 PM    [place]

## 2021-12-07 ENCOUNTER — Encounter: Payer: Self-pay | Admitting: Internal Medicine

## 2021-12-07 ENCOUNTER — Other Ambulatory Visit: Payer: Self-pay

## 2021-12-07 ENCOUNTER — Ambulatory Visit: Payer: Medicare PPO | Admitting: Internal Medicine

## 2021-12-07 VITALS — BP 132/64 | HR 76 | Ht 67.0 in | Wt 136.0 lb

## 2021-12-07 DIAGNOSIS — G20 Parkinson's disease: Secondary | ICD-10-CM

## 2021-12-07 DIAGNOSIS — G903 Multi-system degeneration of the autonomic nervous system: Secondary | ICD-10-CM

## 2021-12-07 NOTE — Patient Instructions (Signed)
Medication Instructions:  Your physician recommends that you continue on your current medications as directed. Please refer to the Current Medication list given to you today.  *If you need a refill on your cardiac medications before your next appointment, please call your pharmacy*   Lab Work: NONE If you have labs (blood work) drawn today and your tests are completely normal, you will receive your results only by: [place] (if you have MyChart) OR A paper copy in the mail If you have any lab test that is abnormal or we need to change your treatment, we will call you to review the results.   Testing/Procedures: NONE   Follow-Up: At [facility] you and your health needs are our priority.  As part of our continuing mission to provide you with exceptional heart care, we have created designated Provider Care Teams.  These Care Teams include your primary Cardiologist (physician) and Advanced Practice Providers (APPs -  Physician Assistants and Nurse Practitioners) who all work together to provide you with the care you need, when you need it.   Your next appointment:   6 month(s)  The format for your next appointment:   In Person  Provider:   Werner Lean, MD

## 2022-01-04 ENCOUNTER — Telehealth: Payer: Self-pay | Admitting: Behavioral Health

## 2022-01-04 ENCOUNTER — Other Ambulatory Visit: Payer: Self-pay

## 2022-01-04 DIAGNOSIS — F330 Major depressive disorder, recurrent, mild: Secondary | ICD-10-CM

## 2022-01-04 DIAGNOSIS — F411 Generalized anxiety disorder: Secondary | ICD-10-CM

## 2022-01-04 MED ORDER — SERTRALINE HCL 100 MG PO TABS: 200.0000 mg | ORAL_TABLET | Freq: Every day | ORAL | 1 refills | Status: DC

## 2022-01-04 NOTE — Telephone Encounter (Signed)
Rx sent 

## 2022-01-04 NOTE — Telephone Encounter (Signed)
Pt requesting a refill of Sertraline.  She increased from 1.5 tabs per day to 2 per day, thus she needs a refill earlier than the pharmacy shows.  Next appt 2/7

## 2022-01-05 NOTE — Progress Notes (Signed)
PATIENT: Desiree Allen DOB: Aug 20, 1946  REASON FOR VISIT: Follow up PD HISTORY FROM: Patient PRIMARY NEUROLOGIST: Dr. Krista Blue   HISTORY  Desiree Allen is a 76 year old female, seen in request by her primary care physician Little, Lennette Bihari for evaluation of bilateral hands tremor, initial evaluation was on March 25, 2020.   I have reviewed and summarized the referring note from the referring physician.  She has past medical history of anxiety, taking Paxil 40 mg daily, is a retired Airline pilot, she also had a history of right arm melanoma resection in 2012.   I saw her initially in November 2017 for bilateral hands tremor, she did complaints lost sense of smell at that time, also vivid dreams, but no excessive movement during her sleep, there was no family history of tremor.   At that visit, she was noted to have fairly symmetric bilateral hands posturing tremor, was diagnosed with possible essential tremor, she denies significant functional limitation in her daily activity   Over the years, bilateral hands tremor gradually getting worse, now she also complains of mild gait abnormality, especially since her fall by accident in November 2020, she also complains of stress incontinence, frequent nocturia, which has helped by oxybutynin   She continues to notice decreased sense of smell, no REM sleep disorder, she also concerned about her gradual onset mild memory loss,   Today's examination showed clearly parkinsonian features, mild bilateral upper extremity rigidity, bradykinesia, but fairly symmetric resting tremor, decreased bilateral arm swing, ambulate   UPDATE July 8th 2021 She reported 60% improvement after starting Sinemet 25/100 mg 3 times a day, she can walk better, less tremor, she did occasionally noticed end dose wearing off, discussed formal diagnosis of Parkinson's disease   Laboratory evaluation from primary care in September 2020 normal CBC, hemoglobin of 14.2, LDL  87, CMP   Personally reviewed DAT scan May 2021   Decrease radiotracer activity within the LEFT and RIGHT stratum is a pattern suggestive of Parkinson's syndrome pathology.   Greater loss of dopamine transport populations on the RIGHT compared to the LEFT.   She continue complains of mild memory loss, urinary incontinence, hyperreflexia on examination,    UPDATE July 06 2021: She is accompanied by her husband at today's visit, she continue has mild depression anxiety, is coming off Paxil, replace with Zoloft 50 mg daily,   She takes Sinemet at 730am, 1pm, 530pm, she goes to bed 10pm, get up 7am.  Early morning is the most difficult time for her, it is hard for her to start moving, felt slow, sluggish, then gets better as day goes by,  She also complains of worsening low back pain, rating pain to right hip, has slow worsening urinary incontinence, wear depends at night, also have accident, it is hard for her to stay in public, worry about bladder incontinence,  She is physically active, exercise regularly, busy at her household chores,   TSH, B12 are normal.   We personally reviewed MRI of cervical spine in July 2021, multilevel degenerative changes, no evidence of canal stenosis, variable degree of foraminal narrowing, most severe at C5-6, C3-4, C4-5  MRI of the brain no acute abnormality, supratentorium small vessel disease  Update January 06, 2022 SS: Here today alone. Taking Sinemet 25/100 mg 1 tablet 8 , 12, 6 with food. Claims the CR Sinemet made her pass out, from low BP dropping when standing. Taking Florinef 0.1 mg, take 1/2 tablet daily, no more spells of  passing out. Feels moving well, not freezing, no trouble getting up in AM. Manages the house, likes to walk outside, stationary bike. Using heating pad for low back, hasn't bother since. Isn't taking anything for pain. Cancelled pain clinic appointment. Never took gabapentin. Mood is good on Zoloft.  Evaluated  REVIEW OF  SYSTEMS: Out of a complete 14 system review of symptoms, the patient complains only of the following symptoms, and all other reviewed systems are negative.  See HPI  ALLERGIES: Allergies  Allergen Reactions   Penicillins Rash    HOME MEDICATIONS: Outpatient Medications Prior to Visit  Medication Sig Dispense Refill   atorvastatin (LIPITOR) 20 MG tablet Take 20 mg by mouth daily.      Cholecalciferol (VITAMIN D) 2000 units CAPS Take by mouth daily.     fludrocortisone (FLORINEF) 0.1 MG tablet Take 1 tablet (0.1 mg total) by mouth daily. 90 tablet 3   Multiple Vitamins-Minerals (WOMENS MULTIVITAMIN PO) Take by mouth daily.     Omega-3 Fatty Acids (FISH OIL) 1200 MG CAPS Take 1,200 mg by mouth daily.     sertraline (ZOLOFT) 100 MG tablet Take 2 tablets (200 mg total) by mouth daily. 60 tablet 1   carbidopa-levodopa (SINEMET IR) 25-100 MG tablet Take 1 tablet by mouth 3 (three) times daily. 270 tablet 4   No facility-administered medications prior to visit.    PAST MEDICAL HISTORY: Past Medical History:  Diagnosis Date   Depression with anxiety    GERD (gastroesophageal reflux disease)    Hyperplastic colon polyp    Melanoma ([place]) 2012   Mixed dyslipidemia    Tremor    Venous insufficiency of both lower extremities    Vitamin D deficiency     PAST SURGICAL HISTORY: Past Surgical History:  Procedure Laterality Date   BUNIONECTOMY Left    CATARACT EXTRACTION Bilateral    MELANOMA EXCISION  2012    FAMILY HISTORY: Family History  Problem Relation Age of Onset   Multiple myeloma Mother    Anemia Father     SOCIAL HISTORY: Social History   Socioeconomic History   Marital status: Married    Spouse name: Roger   Number of children: 3   Years of education: Bachelors   Highest education level: Not on file  Occupational History   Occupation: Retired  Tobacco Use   Smoking status: Never   Smokeless tobacco: Never  Scientific laboratory technician Use: Never used  Substance and  Sexual Activity   Alcohol use: No   Drug use: No   Sexual activity: Not on file  Other Topics Concern   Not on file  Social History Narrative   Lives at home with husband.   Left-handed.   No caffeine use.   Social Determinants of Health   Financial Resource Strain: Not on file  Food Insecurity: Not on file  Transportation Needs: Not on file  Physical Activity: Not on file  Stress: Not on file  Social Connections: Not on file  Intimate Partner Violence: Not on file   PHYSICAL EXAM  Vitals:   01/06/22 1304  BP: 116/70  Pulse: 82  Weight: 135 lb (61.2 kg)  Height: '5\' 7"'  (1.702 m)   Body mass index is 21.14 kg/m.  Generalized: Well developed, in no acute distress  Montreal Cognitive Assessment  07/06/2021  Visuospatial/ Executive (0/5) 3  Naming (0/3) 3  Attention: Read list of digits (0/2) 2  Attention: Read list of letters (0/1) 1  Attention: Serial 7 subtraction  starting at 100 (0/3) 3  Language: Repeat phrase (0/2) 2  Language : Fluency (0/1) 1  Abstraction (0/2) 2  Delayed Recall (0/5) 3  Orientation (0/6) 6  Total 26    Neurological examination  Mentation: Alert oriented to time, place, history taking. Follows all commands speech and language fluent, mild masking of the face Cranial nerve II-XII: Pupils were equal round reactive to light. Extraocular movements were full, visual field were full on confrontational test. Facial sensation and strength were normal.  Head turning and shoulder shrug  were normal and symmetric. Motor: The motor testing reveals 5 over 5 strength of all 4 extremities. Good symmetric motor tone is noted throughout.  Mild bradykinesia, greater on the right upper extremity, rigidity greater as well Sensory: Sensory testing is intact to soft touch on all 4 extremities. No evidence of extinction is noted.  Coordination: Cerebellar testing reveals good finger-nose-finger and heel-to-shin bilaterally.  Gait and station: With arms crossed, has  to rock from seated position x 2 to stand, has a good stride, decreased arm swing on the right Reflexes: Deep tendon reflexes are symmetric and normal bilaterally.   DIAGNOSTIC DATA (LABS, IMAGING, TESTING) - I reviewed patient records, labs, notes, testing and imaging myself where available.  Lab Results  Component Value Date   WBC 8.5 08/07/2021   HGB 13.3 08/07/2021   HCT 38.8 08/07/2021   MCV 93.9 08/07/2021   PLT 208 08/07/2021      Component Value Date/Time   NA 135 08/07/2021 2134   K 3.7 08/07/2021 2134   CL 100 08/07/2021 2134   CO2 28 08/07/2021 2134   GLUCOSE 101 (H) 08/07/2021 2134   BUN 17 08/07/2021 2134   CREATININE 0.74 08/07/2021 2134   CALCIUM 9.0 08/07/2021 2134   GFRNONAA >60 08/07/2021 2134   No results found for: CHOL, HDL, LDLCALC, LDLDIRECT, TRIG, CHOLHDL No results found for: HGBA1C Lab Results  Component Value Date   VITAMINB12 719 07/02/2020   Lab Results  Component Value Date   TSH 2.939 02/03/2021   ASSESSMENT AND PLAN 76 y.o. year old female  has a past medical history of Depression with anxiety, GERD (gastroesophageal reflux disease), Hyperplastic colon polyp, Melanoma ([place]) (2012), Mixed dyslipidemia, Tremor, Venous insufficiency of both lower extremities, and Vitamin D deficiency. here with:  1.  Idiopathic Parkinson's disease -Doing well on current dose of Sinemet  -Continue Sinemet 25/100 mg, 1 tablet 3 times a day, did suggest she take 60 minutes before or after a meal for best results -Stopped CR Sinemet, after report of low BP, making her feel dizzy, denies any decline in her mobility since discontinuation -Remains on Florinef from cardiology -[place] in May 2021 confirmed decreased radiotracer activity at bilateral stratum right worse than left -Return in 5 months or sooner if needed  2.  Mild cognitive impairment -moca 26/30 in July 2022, can recheck next visit, seems stable, but is alone today -MRI of the brain showed mild  age-related changes, no acute abnormality -Laboratory evaluation showed no treatable etiology  3.  Worsening low back pain -Claims has actually resolved with heating pad use, never took gabapentin -MRI lumbar spine in July 2022 showed moderate scoliosis, moderate neuroforaminal stenosis at right L3, left L4-5  Butler Denmark, AGNP-C, DNP 01/06/2022, 1:43 PM [facility] [address] [place], [place] [address] [phone]

## 2022-01-06 ENCOUNTER — Other Ambulatory Visit: Payer: Self-pay

## 2022-01-06 ENCOUNTER — Encounter: Payer: Self-pay | Admitting: Neurology

## 2022-01-06 ENCOUNTER — Ambulatory Visit: Payer: Medicare PPO | Admitting: Neurology

## 2022-01-06 VITALS — BP 116/70 | HR 82 | Ht 67.0 in | Wt 135.0 lb

## 2022-01-06 DIAGNOSIS — G20 Parkinson's disease: Secondary | ICD-10-CM | POA: Diagnosis not present

## 2022-01-06 DIAGNOSIS — R413 Other amnesia: Secondary | ICD-10-CM

## 2022-01-06 MED ORDER — CARBIDOPA-LEVODOPA 25-100 MG PO TABS: 1.0000 | ORAL_TABLET | Freq: Three times a day (TID) | ORAL | 4 refills | Status: DC

## 2022-01-06 NOTE — Patient Instructions (Signed)
Continue the Sinemet 1 tablet 3 times daily  Let me know of any changes  See you back in 5 months

## 2022-01-10 ENCOUNTER — Ambulatory Visit: Payer: Medicare PPO | Admitting: Neurology

## 2022-01-13 ENCOUNTER — Telehealth: Payer: Self-pay | Admitting: Neurology

## 2022-01-13 NOTE — Telephone Encounter (Signed)
Pt is asking for a call back to discuss concerns pt has about:carbidopa-levodopa (SINEMET IR) 25-100 MG tablet Pt has been experiencing problems with her digestive system.  Pt states everytime she eats she feels a tightness in her mid section, pt unsure if this is from the carbidopa-levodopa (SINEMET IR) 25-100 MG tablet .  Pt states it started off being a once in a while thing, it has become an almost everyday concern.  Please call.

## 2022-01-13 NOTE — Telephone Encounter (Signed)
Will discuss with sara verbally.

## 2022-01-13 NOTE — Telephone Encounter (Signed)
I discussed verbally with Judson Roch on this message. She sts if the pt did better when taking the medication with food she could resume this.  I called the pt and relayed recommendation via vm ( ok per dpr). Advised pt to call back if she had any questions/concerns.

## 2022-01-25 ENCOUNTER — Other Ambulatory Visit: Payer: Self-pay | Admitting: Internal Medicine

## 2022-02-01 ENCOUNTER — Ambulatory Visit (INDEPENDENT_AMBULATORY_CARE_PROVIDER_SITE_OTHER): Payer: Medicare PPO | Admitting: Behavioral Health

## 2022-02-01 ENCOUNTER — Other Ambulatory Visit: Payer: Self-pay

## 2022-02-01 ENCOUNTER — Encounter: Payer: Self-pay | Admitting: Behavioral Health

## 2022-02-01 DIAGNOSIS — F330 Major depressive disorder, recurrent, mild: Secondary | ICD-10-CM

## 2022-02-01 DIAGNOSIS — F411 Generalized anxiety disorder: Secondary | ICD-10-CM | POA: Diagnosis not present

## 2022-02-01 MED ORDER — SERTRALINE HCL 100 MG PO TABS: 200.0000 mg | ORAL_TABLET | Freq: Every day | ORAL | 5 refills | Status: DC

## 2022-02-01 MED ORDER — SERTRALINE HCL 100 MG PO TABS: 200.0000 mg | ORAL_TABLET | Freq: Every day | ORAL | 1 refills | Status: DC

## 2022-02-01 NOTE — Progress Notes (Signed)
[facility]  Patient ID: Desiree Allen,  MRN: [id number]  PCP: [facility] @ [place]  Date of Evaluation: 02/01/2022 Time spent:20 minutes  Chief Complaint:  Chief Complaint   Anxiety; Depression; Medication Refill; Follow-up     HISTORY/CURRENT STATUS: HPI [name] presents for follow-up and medication management. She says that she is doing very well since last visit. She says she has been doing better except for the progression of Parkinson's.  Her  husband says that he has seen improvement since increase in Zoloft. She does not want to make any medication adjustments at this time. She says her anxiety and depression has improved to what she calls a manageable level. She reports anxiety 3/10 and depression at 2/10. Says that she is sleeping 7 hours per night. No mania, no psychosis. Some short term memory loss as expected with advanced age. No SI/HI.    Past  psychiatric medications Prozac Paxi      Individual Medical History/ Review of Systems: Changes? :No   Allergies: Penicillins  Current Medications:  Current Outpatient Medications:    atorvastatin (LIPITOR) 20 MG tablet, Take 20 mg by mouth daily. , Disp: , Rfl:    carbidopa-levodopa (SINEMET IR) 25-100 MG tablet, Take 1 tablet by mouth 3 (three) times daily., Disp: 270 tablet, Rfl: 4   Cholecalciferol (VITAMIN D) 2000 units CAPS, Take by mouth daily., Disp: , Rfl:    fludrocortisone (FLORINEF) 0.1 MG tablet, TAKE ONE TABLET BY MOUTH ONE TIME DAILY, Disp: 90 tablet, Rfl: 3   Multiple Vitamins-Minerals (WOMENS MULTIVITAMIN PO), Take by mouth daily., Disp: , Rfl:    Omega-3 Fatty Acids (FISH OIL) 1200 MG CAPS, Take 1,200 mg by mouth daily., Disp: , Rfl:    sertraline (ZOLOFT) 100 MG tablet, Take 2 tablets (200 mg total) by mouth daily., Disp: 60 tablet, Rfl: 5 Medication Side Effects: none  Family Medical/ Social History: Changes? No  MENTAL HEALTH EXAM:  Weight 132 lb (59.9  kg).Body mass index is 20.67 kg/m.  General Appearance: Casual, Neat, and Well Groomed  Eye Contact:  Good  Speech:  Clear and Coherent  Volume:  Decreased  Mood:  NA  Affect:  Flat and Restricted  Thought Process:  Coherent  Orientation:  Full (Time, Place, and Person)  Thought Content: Logical   Suicidal Thoughts:  No  Homicidal Thoughts:  No  Memory:  WNL  Judgement:  Good  Insight:  Good  Psychomotor Activity:  Normal  Concentration:  Concentration: Good  Recall:  Good  Fund of Knowledge: Good  Language: Good  Assets:  Desire for Improvement  ADL's:  Intact  Cognition: Impaired,  Mild  Prognosis:  Fair    DIAGNOSES:    ICD-10-CM   1. Generalized anxiety disorder  F41.1 sertraline (ZOLOFT) 100 MG tablet    DISCONTINUED: sertraline (ZOLOFT) 100 MG tablet    2. Mild episode of recurrent major depressive disorder (HCC)  F33.0 sertraline (ZOLOFT) 100 MG tablet    DISCONTINUED: sertraline (ZOLOFT) 100 MG tablet      Receiving Psychotherapy: No    RECOMMENDATIONS:  Greater than 50% of  30 min face to face time with patient was spent on counseling and coordination of care. We discussed her current depression and anxiety levels and and improvement self-reported to be manageable level.  She is smiling this visit and appears to be more stabile.    Continue Zoloft 200 mg daily.  She started increased Zoloft on 10/04/2021.  No changes  this visit and will follow up in 6 months to reevaluate.  She is stable and in currently in partial remission. Provided emergency contact information. Reviewed PDMD      Elwanda Brooklyn, NP

## 2022-03-02 DIAGNOSIS — E782 Mixed hyperlipidemia: Secondary | ICD-10-CM | POA: Diagnosis not present

## 2022-03-02 DIAGNOSIS — G20 Parkinson's disease: Secondary | ICD-10-CM | POA: Diagnosis not present

## 2022-03-02 DIAGNOSIS — N1831 Chronic kidney disease, stage 3a: Secondary | ICD-10-CM | POA: Diagnosis not present

## 2022-03-07 ENCOUNTER — Ambulatory Visit: Payer: Medicare PPO | Admitting: Podiatry

## 2022-03-14 ENCOUNTER — Ambulatory Visit: Payer: Medicare PPO | Admitting: Podiatrist

## 2022-03-14 ENCOUNTER — Other Ambulatory Visit: Payer: Self-pay

## 2022-03-14 ENCOUNTER — Encounter: Payer: Self-pay | Admitting: Podiatrist

## 2022-03-14 DIAGNOSIS — B351 Tinea unguium: Secondary | ICD-10-CM

## 2022-03-14 NOTE — Patient Instructions (Signed)
The medication that is used to treat toenail fungus is called Lamisil (terbinafine)  you take it every day for 90 days and the new nail that grows is clear (which pushes the old nail out that is fungal) ? ?It requires a healthy liver to take the medication-  we get a liver function test to make sure the liver is healthy enough to take this medication ? ?Check with your doctor who treats your parkinsons to see if it would be a problem for you to take the lamisil or if there are any known interactions that would interact with the Lamisil. ? ?If there are no known interactions/ risks, we can order a liver function panel (or this can also be ordered by your primary care physician)  and can start you on the lamisil. ? ?Topicals are not effective and laser is mildly effective but an option if you would want to try this before trying a pill.  We do about 4-6 laser treatments and the cost is around $100 per treatment.   ? ?Let me know if you would like to proceed with either of these options and we are happy to get you started.   ?

## 2022-03-15 ENCOUNTER — Encounter: Payer: Self-pay | Admitting: Podiatrist

## 2022-03-17 NOTE — Progress Notes (Signed)
?Chief Complaint  ?Patient presents with  ? Nail Problem  ?   ?Bilateral nails fungus ...  ?  ? ?HPI: Patient is 76 y.o. female who presents today for digital nail fungus on bilateral feet.  She states she has had it for over a year she started on 1 toe and then spread.  She was using Vicks vapor rub with no improvement noted. ? ?Patient Active Problem List  ? Diagnosis Date Noted  ? Low back pain 09/22/2021  ? Parkinson's disease ([place]) 07/06/2021  ? Chronic right-sided low back pain with right-sided sciatica 07/06/2021  ? Snores 07/06/2021  ? Other hyperlipidemia 05/28/2021  ? Family history of aortic aneurysm 02/18/2021  ? Memory loss 03/24/2020  ? Parkinson's disease with neurogenic orthostatic hypotension ([place]) 03/24/2020  ? Gait abnormality 03/24/2020  ? Pleural effusion on left 12/02/2019  ? Essential tremor 11/21/2016  ? ? ?Current Outpatient Medications on File Prior to Visit  ?Medication Sig Dispense Refill  ? atorvastatin (LIPITOR) 20 MG tablet Take 20 mg by mouth daily.     ? carbidopa-levodopa (SINEMET IR) 25-100 MG tablet Take 1 tablet by mouth 3 (three) times daily. 270 tablet 4  ? Cholecalciferol (VITAMIN D) 2000 units CAPS Take by mouth daily.    ? fludrocortisone (FLORINEF) 0.1 MG tablet TAKE ONE TABLET BY MOUTH ONE TIME DAILY 90 tablet 3  ? Multiple Vitamins-Minerals (WOMENS MULTIVITAMIN PO) Take by mouth daily.    ? Omega-3 Fatty Acids (FISH OIL) 1200 MG CAPS Take 1,200 mg by mouth daily.    ? sertraline (ZOLOFT) 100 MG tablet Take 2 tablets (200 mg total) by mouth daily. 60 tablet 5  ? ?No current facility-administered medications on file prior to visit.  ? ? ?Allergies  ?Allergen Reactions  ? Penicillins Rash  ? ? ?Review of Systems ?No fevers, chills, nausea, muscle aches, no difficulty breathing, no calf pain, no chest pain or shortness of breath. ? ? ?Physical Exam ? ?GENERAL APPEARANCE: Alert, conversant. Appropriately groomed. No acute distress.  ? ?VASCULAR: Pedal pulses palpable DP and  PT bilateral.  Capillary refill time is immediate to all digits,  Proximal to distal cooling it warm to warm.  Digital perfusion adequate.  ? ?NEUROLOGIC: sensation is intact to 5.07 monofilament at 5/5 sites bilateral.  Light touch is intact bilateral, vibratory sensation intact bilateral ? ?MUSCULOSKELETAL: acceptable muscle strength, tone and stability bilateral.  No gross boney pedal deformities noted.  No pain, crepitus or limitation noted with foot and ankle range of motion bilateral.  ? ?DERMATOLOGIC: skin is warm, supple, and dry.  No open lesions noted.  No rash, no pre ulcerative lesions. Digital nails yellowish, brittle, thickened with subungual debris present.  Clinically they do appear to be mycotic. ? ? ? ?Assessment  ? ?  ICD-10-CM   ?1. Onychomycosis  B35.1   ?  ? ? ? ?Plan ? ?Exam findings were discussed with the patient.  We discussed topical versus laser versus oral therapy.  Due to the level of involvement of the nails I discussed that oral Lamisil therapy is likely the best option for treating the fungus.  At this time the patient would like to hold off on any oral treatment due to the other medications that she is currently taking.  As a courtesy I did trim her nails at today's visit without complication and if she would like it done again for her in the future she will call.  Otherwise she will be seen back as needed for  follow-up. ?

## 2022-03-29 DIAGNOSIS — L218 Other seborrheic dermatitis: Secondary | ICD-10-CM | POA: Diagnosis not present

## 2022-03-29 DIAGNOSIS — Z85828 Personal history of other malignant neoplasm of skin: Secondary | ICD-10-CM | POA: Diagnosis not present

## 2022-03-29 DIAGNOSIS — Z85820 Personal history of malignant melanoma of skin: Secondary | ICD-10-CM | POA: Diagnosis not present

## 2022-03-29 DIAGNOSIS — D692 Other nonthrombocytopenic purpura: Secondary | ICD-10-CM | POA: Diagnosis not present

## 2022-03-29 DIAGNOSIS — L570 Actinic keratosis: Secondary | ICD-10-CM | POA: Diagnosis not present

## 2022-03-29 DIAGNOSIS — L821 Other seborrheic keratosis: Secondary | ICD-10-CM | POA: Diagnosis not present

## 2022-03-30 ENCOUNTER — Telehealth: Payer: Self-pay | Admitting: Neurology

## 2022-03-30 NOTE — Telephone Encounter (Signed)
The patient is seen here for Parkison's Disease and memory loss. Recent visit on 01/06/2022. Reported doing well on Sinemet 25/'100mg'$ , one tab TID. She has been on this medication since 2021 without adverse side effects. ? ?I called her back. Reports new onset of chest tightness. Mildly difficult to take a deep breath. Present for at least eight weeks. No shortness of breath or chest pain.  No issues with swallowing. No signs/symptoms of infection. No heartburn. We talked about how many different things can cause this type of feeling. She has been on her current medication for two years. Not likely related. I instructed her to see her PCP for an initial work-up so they can determine next best step. If they feel she needs to see neurology, then she will call us back.  ? ?If she develops worsening, concerning symptoms, that causes her to fear for her medical safety, then she should call 911 or proceed to the ED.  ?

## 2022-03-30 NOTE — Telephone Encounter (Signed)
Pt states she wants to speak with RN re: digestive issues she has had for a while on the carbidopa-levodopa (SINEMET IR) 25-100 MG tablet, ?Pt feels as if she is choking in her chest even hours after she has eaten ?

## 2022-03-31 DIAGNOSIS — R109 Unspecified abdominal pain: Secondary | ICD-10-CM | POA: Diagnosis not present

## 2022-04-01 ENCOUNTER — Other Ambulatory Visit: Payer: Self-pay | Admitting: Family Medicine

## 2022-04-01 DIAGNOSIS — R109 Unspecified abdominal pain: Secondary | ICD-10-CM

## 2022-04-08 ENCOUNTER — Ambulatory Visit
Admission: RE | Admit: 2022-04-08 | Discharge: 2022-04-08 | Disposition: A | Discharge status: Home / Self Care | Payer: Medicare PPO | Source: Ambulatory Visit | Attending: Family Medicine | Admitting: Family Medicine

## 2022-04-08 DIAGNOSIS — R109 Unspecified abdominal pain: Secondary | ICD-10-CM | POA: Diagnosis not present

## 2022-05-03 DIAGNOSIS — M533 Sacrococcygeal disorders, not elsewhere classified: Secondary | ICD-10-CM | POA: Diagnosis not present

## 2022-05-29 NOTE — Progress Notes (Unsigned)
Cardiology Office Note:    Date:  05/30/2022   ID:  Desiree Allen, DOB 08/31/1946, MRN [id number]  PCP:  Lawerance Cruel, [facility]  Cardiologist:  Werner Lean, MD  Advanced Practice Provider:  No care team member to display Electrophysiologist:  None      CC: Orthostatic Hypotension in the setting of Parkinson's Disease Follow up  History of Present Illness:    Desiree Allen is a 76 y.o. female with a hx of Orthostatic Hypotension and Parkinson's Disease who presents for evaluation 02/18/21.  2022: Unable to get droxidopa and was started on florinef.  Patient was unable to reach PCP about hospital bed and we assisted with this one time (patient deferred in the setting of a used hospital bed). Had recent return of symptoms prompting urgent visit 08/23/21. Her symptoms worsened when this was reduced and we restarted the medication.    Patient notes that she is doing well.   Since last visit notes that she has good days and bad days related to Parkinson's . There are no interval hospital/ED visit.    No chest pain or pressure.  No SOB/DOE and no PND/Orthopnea.  No weight gain or leg swelling.  No palpitations or syncope.  Has been told that she is having issues with Parkinson's Disease and may need to increase her Sinemet dose.  Has questions about whether she will need a higher dose of florinef.   Past Medical History:  Diagnosis Date   Depression with anxiety    GERD (gastroesophageal reflux disease)    Hyperplastic colon polyp    Melanoma ([place]) 2012   Mixed dyslipidemia    Tremor    Venous insufficiency of both lower extremities    Vitamin D deficiency     Past Surgical History:  Procedure Laterality Date   BUNIONECTOMY Left    CATARACT EXTRACTION Bilateral    MELANOMA EXCISION  2012    Current Medications: Current Meds  Medication Sig   atorvastatin (LIPITOR) 20 MG tablet Take 20 mg by mouth daily.    carbidopa-levodopa  (SINEMET IR) 25-100 MG tablet Take 1 tablet by mouth 3 (three) times daily.   Cholecalciferol (VITAMIN D) 2000 units CAPS Take by mouth daily.   fludrocortisone (FLORINEF) 0.1 MG tablet TAKE ONE TABLET BY MOUTH ONE TIME DAILY   Multiple Vitamins-Minerals (WOMENS MULTIVITAMIN PO) Take by mouth daily.   Omega-3 Fatty Acids (FISH OIL) 1200 MG CAPS Take 1,200 mg by mouth daily.   polyethylene glycol (MIRALAX / GLYCOLAX) 17 g packet as needed for constipation.   sertraline (ZOLOFT) 100 MG tablet Take 2 tablets (200 mg total) by mouth daily.     Allergies:   Penicillins   Social History   Socioeconomic History   Marital status: Married    Spouse name: [name]   Number of children: 3   Years of education: Bachelors   Highest education level: Not on file  Occupational History   Occupation: Retired  Tobacco Use   Smoking status: Never   Smokeless tobacco: Never  Vaping Use   Vaping Use: Never used  Substance and Sexual Activity   Alcohol use: No   Drug use: No   Sexual activity: Not on file  Other Topics Concern   Not on file  Social History Narrative   Lives at home with husband.   Left-handed.   No caffeine use.   Social Determinants of Health   Financial Resource Strain: Not  on file  Food Insecurity: Not on file  Transportation Needs: Not on file  Physical Activity: Not on file  Stress: Not on file  Social Connections: Not on file    Social: Yolanda Bonine is getting married then moving to [facility]; other family is in [place]   Family History: The patient's family history includes Anemia in her father; Multiple myeloma in her mother. History of coronary artery disease notable for sister. History of heart failure notable for sister (needed valve replacement). History of arrhythmia notable for palpitations in her father. History of aortic aneurysm (sister) and dissection (mother).  ROS:   Please see the history of present illness.     All other systems reviewed and are  negative.  EKGs/Labs/Other Studies Reviewed:    The following studies were reviewed today:  EKG:   02/08/21: SR 61 RAE, does not meet septal infarct criteria  Transthoracic Echocardiogram: Date: 03/18/21 Results:  1. Left ventricular ejection fraction, by estimation, is 60 to 65%. The  left ventricle has normal function. The left ventricle has no regional  wall motion abnormalities. Left ventricular diastolic parameters are  consistent with Grade I diastolic  dysfunction (impaired relaxation).   2. Right ventricular systolic function is normal. The right ventricular  size is normal.   3. The mitral valve is normal in structure. No evidence of mitral valve  regurgitation. No evidence of mitral stenosis.   4. The aortic valve is normal in structure. Aortic valve regurgitation is  not visualized. No aortic stenosis is present.   5. The inferior vena cava is normal in size with greater than 50%  respiratory variability, suggesting right atrial pressure of 3 mmHg.    Recent Labs: 08/07/2021: BUN 17; Creatinine, Ser 0.74; Hemoglobin 13.3; Platelets 208; Potassium 3.7; Sodium 135  Recent Lipid Panel No results found for: CHOL, TRIG, HDL, CHOLHDL, VLDL, LDLCALC, LDLDIRECT   Physical Exam:    VS:  BP 122/68   Pulse 69   Ht '5\' 7"'  (1.702 m)   Wt 134 lb 9.6 oz (61.1 kg)   SpO2 98%   BMI 21.08 kg/m     Wt Readings from Last 3 Encounters:  05/30/22 134 lb 9.6 oz (61.1 kg)  01/06/22 135 lb (61.2 kg)  12/07/21 136 lb (61.7 kg)    Gen: No distress  Cardiac: No Rubs or Gallops, no Murmur, RRR +2 radial pulses Respiratory: Clear to auscultation bilaterally, normal effort, normal  respiratory rate GI: Soft, nontender, non-distended  MS: No  edema; compression stockings worn,  moves all extremities Integument: Skin feels warm Neuro:  At time of evaluation, alert and oriented to person/place/time/situation  Psych: Speaks less than prior, reserved affect  ASSESSMENT:    1.  Parkinson's disease with neurogenic orthostatic hypotension ([place])   2. Parkinson's disease ([place])   3. Mixed hyperlipidemia     PLAN:     Neurogenic Orthostatic Hypotension Parkinson's Disease HLD - LDL < 100 on current therapy - asymptomatic on current medication  - stress the importance of salt and water intake - re-reviewed education on slow rise, Valsalva maneuver exacerbation, temperature change - discussed muscle contraction and leg crossing - discussed elevation to 30-45 degrees when sleeping  - continue compression stockings if needed next step would be abdominal binders - heating abdominal pad for supine hypertension not needed presently  - reviewed with neurologist; not a droxidopa candidate - discussed risks and benefits of Florinef inclusion (if worsening sx would attempt increase to 0.2 mg) - midodrine would be  second phamacologic eval  Six months with me or APP   Medication Adjustments/Labs and Tests Ordered: Current medicines are reviewed at length with the patient today.  Concerns regarding medicines are outlined above.  No orders of the defined types were placed in this encounter.   No orders of the defined types were placed in this encounter.    Patient Instructions  Medication Instructions:  Your physician recommends that you continue on your current medications as directed. Please refer to the Current Medication list given to you today.  *If you need a refill on your cardiac medications before your next appointment, please call your pharmacy*   Lab Work: NONE If you have labs (blood work) drawn today and your tests are completely normal, you will receive your results only by: [place] (if you have MyChart) OR A paper copy in the mail If you have any lab test that is abnormal or we need to change your treatment, we will call you to review the results.   Testing/Procedures: NONE   Follow-Up: At [facility], you and your health needs  are our priority.  As part of our continuing mission to provide you with exceptional heart care, we have created designated Provider Care Teams.  These Care Teams include your primary Cardiologist (physician) and Advanced Practice Providers (APPs -  Physician Assistants and Nurse Practitioners) who all work together to provide you with the care you need, when you need it.  We recommend signing up for the patient portal called "MyChart".  Sign up information is provided on this After Visit Summary.  MyChart is used to connect with patients for Virtual Visits (Telemedicine).  Patients are able to view lab/test results, encounter notes, upcoming appointments, etc.  Non-urgent messages can be sent to your provider as well.   To learn more about what you can do with MyChart, go to [email or web address].    Your next appointment:   6 month(s)  The format for your next appointment:   In Person  Provider:   Werner Lean, MD     Important Information About Sugar         Signed, Werner Lean, MD  05/30/2022 2:20 PM    [place]

## 2022-05-30 ENCOUNTER — Ambulatory Visit: Payer: Medicare PPO | Admitting: Internal Medicine

## 2022-05-30 ENCOUNTER — Encounter: Payer: Self-pay | Admitting: Internal Medicine

## 2022-05-30 VITALS — BP 122/68 | HR 69 | Ht 67.0 in | Wt 134.6 lb

## 2022-05-30 DIAGNOSIS — E782 Mixed hyperlipidemia: Secondary | ICD-10-CM

## 2022-05-30 DIAGNOSIS — G903 Multi-system degeneration of the autonomic nervous system: Secondary | ICD-10-CM

## 2022-05-30 DIAGNOSIS — G20 Parkinson's disease: Secondary | ICD-10-CM

## 2022-05-30 NOTE — Patient Instructions (Signed)
Medication Instructions:  Your physician recommends that you continue on your current medications as directed. Please refer to the Current Medication list given to you today.  *If you need a refill on your cardiac medications before your next appointment, please call your pharmacy*   Lab Work: NONE If you have labs (blood work) drawn today and your tests are completely normal, you will receive your results only by: MyChart Message (if you have MyChart) OR A paper copy in the mail If you have any lab test that is abnormal or we need to change your treatment, we will call you to review the results.   Testing/Procedures: NONE   Follow-Up: At [facility], you and your health needs are our priority.  As part of our continuing mission to provide you with exceptional heart care, we have created designated Provider Care Teams.  These Care Teams include your primary Cardiologist (physician) and Advanced Practice Providers (APPs -  Physician Assistants and Nurse Practitioners) who all work together to provide you with the care you need, when you need it.  We recommend signing up for the patient portal called "MyChart".  Sign up information is provided on this After Visit Summary.  MyChart is used to connect with patients for Virtual Visits (Telemedicine).  Patients are able to view lab/test results, encounter notes, upcoming appointments, etc.  Non-urgent messages can be sent to your provider as well.   To learn more about what you can do with MyChart, go to [email or web address].    Your next appointment:   6 month(s)  The format for your next appointment:   In Person  Provider:   Mahesh A Chandrasekhar, MD    Important Information About Sugar       

## 2022-05-31 DIAGNOSIS — H40013 Open angle with borderline findings, low risk, bilateral: Secondary | ICD-10-CM | POA: Diagnosis not present

## 2022-05-31 DIAGNOSIS — H40053 Ocular hypertension, bilateral: Secondary | ICD-10-CM | POA: Diagnosis not present

## 2022-06-22 NOTE — Progress Notes (Unsigned)
PATIENT: Desiree Allen DOB: 1946-12-07  REASON FOR VISIT: Follow up PD HISTORY FROM: Patient PRIMARY NEUROLOGIST: Dr. Krista Blue   HISTORY  Desiree Allen is a 76 year old female, seen in request by her primary care physician Little, Lennette Bihari for evaluation of bilateral hands tremor, initial evaluation was on March 25, 2020.   I have reviewed and summarized the referring note from the referring physician.  She has past medical history of anxiety, taking Paxil 40 mg daily, is a retired Airline pilot, she also had a history of right arm melanoma resection in 2012.   I saw her initially in November 2017 for bilateral hands tremor, she did complaints lost sense of smell at that time, also vivid dreams, but no excessive movement during her sleep, there was no family history of tremor.   At that visit, she was noted to have fairly symmetric bilateral hands posturing tremor, was diagnosed with possible essential tremor, she denies significant functional limitation in her daily activity   Over the years, bilateral hands tremor gradually getting worse, now she also complains of mild gait abnormality, especially since her fall by accident in November 2020, she also complains of stress incontinence, frequent nocturia, which has helped by oxybutynin   She continues to notice decreased sense of smell, no REM sleep disorder, she also concerned about her gradual onset mild memory loss,   Today's examination showed clearly parkinsonian features, mild bilateral upper extremity rigidity, bradykinesia, but fairly symmetric resting tremor, decreased bilateral arm swing, ambulate   UPDATE July 8th 2021 She reported 60% improvement after starting Sinemet 25/100 mg 3 times a day, she can walk better, less tremor, she did occasionally noticed end dose wearing off, discussed formal diagnosis of Parkinson's disease   Laboratory evaluation from primary care in September 2020 normal CBC, hemoglobin of 14.2, LDL  87, CMP   Personally reviewed DAT scan May 2021   Decrease radiotracer activity within the LEFT and RIGHT stratum is a pattern suggestive of Parkinson's syndrome pathology.   Greater loss of dopamine transport populations on the RIGHT compared to the LEFT.   She continue complains of mild memory loss, urinary incontinence, hyperreflexia on examination,    UPDATE July 06 2021: She is accompanied by her husband at today's visit, she continue has mild depression anxiety, is coming off Paxil, replace with Zoloft 50 mg daily,   She takes Sinemet at 730am, 1pm, 530pm, she goes to bed 10pm, get up 7am.  Early morning is the most difficult time for her, it is hard for her to start moving, felt slow, sluggish, then gets better as day goes by,  She also complains of worsening low back pain, rating pain to right hip, has slow worsening urinary incontinence, wear depends at night, also have accident, it is hard for her to stay in public, worry about bladder incontinence,  She is physically active, exercise regularly, busy at her household chores,   TSH, B12 are normal.   We personally reviewed MRI of cervical spine in July 2021, multilevel degenerative changes, no evidence of canal stenosis, variable degree of foraminal narrowing, most severe at C5-6, C3-4, C4-5  MRI of the brain no acute abnormality, supratentorium small vessel disease  Update January 06, 2022 Desiree Allen: Here today alone. Taking Sinemet 25/100 mg 1 tablet 8 , 12, 6 with food. Claims the CR Sinemet made her pass out, from low BP dropping when standing. Taking Florinef 0.1 mg, take 1/2 tablet daily, no more spells of  passing out. Feels moving well, not freezing, no trouble getting up in AM. Manages the house, likes to walk outside, stationary bike. Using heating pad for low back, hasn't bother since. Isn't taking anything for pain. Cancelled pain clinic appointment. Never took gabapentin. Mood is good on Zoloft.  Evaluated  Update June 23, 2022 Desiree Allen: Taking Sinemet 25/100 mg 3 times daily, has good and bad days. 1 fall fell backwards, bruised tailbone in May, much better. No significant back pain right now. Concerned with memory, isn't driving, husband doesn't think safe to drive. Misplaces things, forget what she was looking for. Does her own ADLs, medications, household. Has urinary urgency, has been urology. MOCA 24/30.  REVIEW OF SYSTEMS: Out of a complete 14 system review of symptoms, the patient complains only of the following symptoms, and all other reviewed systems are negative.  See HPI  ALLERGIES: Allergies  Allergen Reactions   Penicillins Rash    HOME MEDICATIONS: Outpatient Medications Prior to Visit  Medication Sig Dispense Refill   atorvastatin (LIPITOR) 20 MG tablet Take 20 mg by mouth daily.      Cholecalciferol (VITAMIN D) 2000 units CAPS Take by mouth daily.     fludrocortisone (FLORINEF) 0.1 MG tablet TAKE ONE TABLET BY MOUTH ONE TIME DAILY 90 tablet 3   Multiple Vitamins-Minerals (WOMENS MULTIVITAMIN PO) Take by mouth daily.     Omega-3 Fatty Acids (FISH OIL) 1200 MG CAPS Take 1,200 mg by mouth daily.     polyethylene glycol (MIRALAX / GLYCOLAX) 17 g packet as needed for constipation.     sertraline (ZOLOFT) 100 MG tablet Take 2 tablets (200 mg total) by mouth daily. 60 tablet 5   carbidopa-levodopa (SINEMET IR) 25-100 MG tablet Take 1 tablet by mouth 3 (three) times daily. 270 tablet 4   No facility-administered medications prior to visit.    PAST MEDICAL HISTORY: Past Medical History:  Diagnosis Date   Depression with anxiety    GERD (gastroesophageal reflux disease)    Hyperplastic colon polyp    Melanoma (Colon) 2012   Mixed dyslipidemia    Tremor    Venous insufficiency of both lower extremities    Vitamin D deficiency     PAST SURGICAL HISTORY: Past Surgical History:  Procedure Laterality Date   BUNIONECTOMY Left    CATARACT EXTRACTION Bilateral    MELANOMA EXCISION  2012    FAMILY  HISTORY: Family History  Problem Relation Age of Onset   Multiple myeloma Mother    Anemia Father     SOCIAL HISTORY: Social History   Socioeconomic History   Marital status: Married    Spouse name: Roger   Number of children: 3   Years of education: Bachelors   Highest education level: Not on file  Occupational History   Occupation: Retired  Tobacco Use   Smoking status: Never   Smokeless tobacco: Never  Scientific laboratory technician Use: Never used  Substance and Sexual Activity   Alcohol use: No   Drug use: No   Sexual activity: Not on file  Other Topics Concern   Not on file  Social History Narrative   Lives at home with husband.   Left-handed.   No caffeine use.   Social Determinants of Health   Financial Resource Strain: Not on file  Food Insecurity: Not on file  Transportation Needs: Not on file  Physical Activity: Not on file  Stress: Not on file  Social Connections: Not on file  Intimate Partner Violence: Not  on file   PHYSICAL EXAM  Vitals:   06/23/22 1258  BP: 110/61  Pulse: 72  Weight: 133 lb (60.3 kg)  Height: '5\' 7"'  (1.702 m)    Body mass index is 20.83 kg/m.  Generalized: Well developed, in no acute distress     07/06/2021   12:00 PM  Montreal Cognitive Assessment   Visuospatial/ Executive (0/5) 3  Naming (0/3) 3  Attention: Read list of digits (0/2) 2  Attention: Read list of letters (0/1) 1  Attention: Serial 7 subtraction starting at 100 (0/3) 3  Language: Repeat phrase (0/2) 2  Language : Fluency (0/1) 1  Abstraction (0/2) 2  Delayed Recall (0/5) 3  Orientation (0/6) 6  Total 26    Neurological examination  Mentation: Alert oriented to time, place, history taking. Follows all commands speech and language fluent, mild masking of the face Cranial nerve II-XII: Pupils were equal round reactive to light. Extraocular movements were full, visual field were full on confrontational test. Facial sensation and strength were normal.  Head  turning and shoulder shrug  were normal and symmetric. Motor: The motor testing reveals 5 over 5 strength of all 4 extremities. Good symmetric motor tone is noted throughout.  Mild bradykinesia, greater on the left upper extremity, rigidity greater as well. Resting tremor left hand. Sensory: Sensory testing is intact to soft touch on all 4 extremities. No evidence of extinction is noted.  Coordination: Cerebellar testing reveals good finger-nose-finger and heel-to-shin bilaterally.  Gait and station: With arms crossed, has to rock from seated position x 3 finally has to push off to stand, has a good stride, decreased arm swing on the left Reflexes: Deep tendon reflexes are symmetric and normal bilaterally.   DIAGNOSTIC DATA (LABS, IMAGING, TESTING) - I reviewed patient records, labs, notes, testing and imaging myself where available.  Lab Results  Component Value Date   WBC 8.5 08/07/2021   HGB 13.3 08/07/2021   HCT 38.8 08/07/2021   MCV 93.9 08/07/2021   PLT 208 08/07/2021      Component Value Date/Time   NA 135 08/07/2021 2134   K 3.7 08/07/2021 2134   CL 100 08/07/2021 2134   CO2 28 08/07/2021 2134   GLUCOSE 101 (H) 08/07/2021 2134   BUN 17 08/07/2021 2134   CREATININE 0.74 08/07/2021 2134   CALCIUM 9.0 08/07/2021 2134   GFRNONAA >60 08/07/2021 2134   No results found for: "CHOL", "HDL", "LDLCALC", "LDLDIRECT", "TRIG", "CHOLHDL" No results found for: "HGBA1C" Lab Results  Component Value Date   VITAMINB12 719 07/02/2020   Lab Results  Component Value Date   TSH 2.939 02/03/2021   ASSESSMENT AND PLAN 76 y.o. year old female   1.  Idiopathic Parkinson's disease -Continue Sinemet 25/100 mg 3 times daily -No significant bradykinesia or freezing -Encouraged moderate exercise -On Florinef from cardiology -CR Sinemet resulted in hypotension -Datscan in May 2021 confirmed decreased radiotracer activity at bilateral stratum right worse than left  2.  Mild cognitive  impairment -MOCA 24/30 today -We discussed Namenda, wants to hold off on adding in memory medications -MRI of the brain showed mild age-related changes, no acute abnormality -Laboratory evaluation showed no treatable etiology  3.  Worsening low back pain -Under good control -MRI lumbar spine in July 2022 showed moderate scoliosis, moderate neuroforaminal stenosis at right L3, left L4-5  Butler Denmark, AGNP-C, DNP 06/23/2022, 1:58 PM [facility] [address], [place], [place] [address] [phone]

## 2022-06-23 ENCOUNTER — Ambulatory Visit: Payer: Medicare PPO | Admitting: Neurology

## 2022-06-23 ENCOUNTER — Encounter: Payer: Self-pay | Admitting: Neurology

## 2022-06-23 VITALS — BP 110/61 | HR 72 | Ht 67.0 in | Wt 133.0 lb

## 2022-06-23 DIAGNOSIS — R413 Other amnesia: Secondary | ICD-10-CM

## 2022-06-23 DIAGNOSIS — G20 Parkinson's disease: Secondary | ICD-10-CM | POA: Diagnosis not present

## 2022-06-23 MED ORDER — CARBIDOPA-LEVODOPA 25-100 MG PO TABS
1.0000 | ORAL_TABLET | Freq: Three times a day (TID) | ORAL | 4 refills | Status: DC
Start: 2022-06-23 — End: 2022-09-08

## 2022-06-23 NOTE — Patient Instructions (Signed)
Continue current dose Sinemet  Follow memory overtime Continue moderate exercises See you back in 6 months

## 2022-07-08 DIAGNOSIS — H00025 Hordeolum internum left lower eyelid: Secondary | ICD-10-CM | POA: Diagnosis not present

## 2022-08-02 ENCOUNTER — Ambulatory Visit (INDEPENDENT_AMBULATORY_CARE_PROVIDER_SITE_OTHER): Payer: Medicare PPO | Admitting: Behavioral Health

## 2022-08-02 ENCOUNTER — Encounter: Payer: Self-pay | Admitting: Behavioral Health

## 2022-08-02 DIAGNOSIS — F411 Generalized anxiety disorder: Secondary | ICD-10-CM | POA: Diagnosis not present

## 2022-08-02 DIAGNOSIS — F330 Major depressive disorder, recurrent, mild: Secondary | ICD-10-CM

## 2022-08-02 MED ORDER — SERTRALINE HCL 100 MG PO TABS: 200.0000 mg | ORAL_TABLET | Freq: Every day | ORAL | 5 refills | Status: DC

## 2022-08-02 NOTE — Progress Notes (Signed)
[facility]  Patient ID: Desiree Allen,  MRN: [id number]  PCP: Lawerance Cruel, MD  Date of Evaluation: 08/02/2022 Time spent:20 minutes  Chief Complaint:  Chief Complaint   Anxiety; Depression; Follow-up; Medication Refill     HISTORY/CURRENT STATUS: HPI  Desiree Allen presents for follow-up and medication management. No changes this visit. She says that she is doing very well since last visit. She says she has been doing better except for the progression of Parkinson's.  Her  husband says that he has seen improvement since increase in Zoloft. She does not want to make any medication adjustments at this time. She says her anxiety and depression has improved to what she calls a manageable level. She reports anxiety 2/10 and depression at 1/10. Says that she is sleeping 7 hours per night. No mania, no psychosis. Some short term memory loss as expected with advanced age. No SI/HI.    Past  psychiatric medications Prozac Paxil   Individual Medical History/ Review of Systems: Changes? :No   Allergies: Penicillins  Current Medications:  Current Outpatient Medications:    atorvastatin (LIPITOR) 20 MG tablet, Take 20 mg by mouth daily. , Disp: , Rfl:    carbidopa-levodopa (SINEMET IR) 25-100 MG tablet, Take 1 tablet by mouth 3 (three) times daily., Disp: 270 tablet, Rfl: 4   Cholecalciferol (VITAMIN D) 2000 units CAPS, Take by mouth daily., Disp: , Rfl:    fludrocortisone (FLORINEF) 0.1 MG tablet, TAKE ONE TABLET BY MOUTH ONE TIME DAILY, Disp: 90 tablet, Rfl: 3   Multiple Vitamins-Minerals (WOMENS MULTIVITAMIN PO), Take by mouth daily., Disp: , Rfl:    Omega-3 Fatty Acids (FISH OIL) 1200 MG CAPS, Take 1,200 mg by mouth daily., Disp: , Rfl:    polyethylene glycol (MIRALAX / GLYCOLAX) 17 g packet, as needed for constipation., Disp: , Rfl:    sertraline (ZOLOFT) 100 MG tablet, Take 2 tablets (200 mg total) by mouth daily., Disp: 60 tablet, Rfl: 5 Medication Side Effects:  none  Family Medical/ Social History: Changes? No  MENTAL HEALTH EXAM:  There were no vitals taken for this visit.There is no height or weight on file to calculate BMI.  General Appearance: Casual, Neat, and Well Groomed  Eye Contact:  Good  Speech:  Clear and Coherent  Volume:  Normal  Mood:  NA  Affect:  Appropriate  Thought Process:  Coherent  Orientation:  Full (Time, Place, and Person)  Thought Content: Logical   Suicidal Thoughts:  No  Homicidal Thoughts:  No  Memory:  WNL  Judgement:  Good  Insight:  Good  Psychomotor Activity:  Normal  Concentration:  Concentration: Good  Recall:  Good  Fund of Knowledge: Good  Language: Good  Assets:  Desire for Improvement  ADL's:  Intact  Cognition: WNL  Prognosis:  Good    DIAGNOSES:    ICD-10-CM   1. Generalized anxiety disorder  F41.1 sertraline (ZOLOFT) 100 MG tablet    2. Mild episode of recurrent major depressive disorder (HCC)  F33.0 sertraline (ZOLOFT) 100 MG tablet      Receiving Psychotherapy: No    RECOMMENDATIONS:   Greater than 50% of  20  min face to face time with patient was spent on counseling and coordination of care.  No changes this visit. No social changes. We discussed her current depression and anxiety levels and and improvement self-reported to be manageable level.  She is smiling this visit and appears to be more stabile.    Continue  Zoloft 200 mg daily.  She started increased Zoloft on 10/04/2021.  No changes this visit and will follow up in 6 months to reevaluate.  She is stable and in currently in partial remission. Provided emergency contact information. Reviewed PDMD         Elwanda Brooklyn, NP

## 2022-08-19 DIAGNOSIS — Z1231 Encounter for screening mammogram for malignant neoplasm of breast: Secondary | ICD-10-CM | POA: Diagnosis not present

## 2022-08-25 DIAGNOSIS — N6001 Solitary cyst of right breast: Secondary | ICD-10-CM | POA: Diagnosis not present

## 2022-09-07 DIAGNOSIS — G20 Parkinson's disease: Secondary | ICD-10-CM | POA: Diagnosis not present

## 2022-09-07 DIAGNOSIS — N1831 Chronic kidney disease, stage 3a: Secondary | ICD-10-CM | POA: Diagnosis not present

## 2022-09-07 DIAGNOSIS — F329 Major depressive disorder, single episode, unspecified: Secondary | ICD-10-CM | POA: Diagnosis not present

## 2022-09-07 DIAGNOSIS — E782 Mixed hyperlipidemia: Secondary | ICD-10-CM | POA: Diagnosis not present

## 2022-09-08 ENCOUNTER — Telehealth: Payer: Self-pay | Admitting: Neurology

## 2022-09-08 MED ORDER — CARBIDOPA-LEVODOPA 25-100 MG PO TABS: 1.5000 | ORAL_TABLET | Freq: Three times a day (TID) | ORAL | 4 refills | Status: DC

## 2022-09-08 NOTE — Telephone Encounter (Signed)
Can try Sinemet 1.5 tablets 3 times daily. Watch for drop in BP, dizziness.   Meds ordered this encounter  Medications   carbidopa-levodopa (SINEMET IR) 25-100 MG tablet    Sig: Take 1.5 tablets by mouth 3 (three) times daily.    Dispense:  405 tablet    Refill:  4    Refills on file

## 2022-09-08 NOTE — Telephone Encounter (Signed)
I called the pt back. She reports she has been taking the Carb/Levo 25-100 mg tid but is struggling with tremors and shaking in the morning. Sx are all over. She wanted to know if her medication could be increased? She reports she is tolerating it well but feels like  higher dosage could be beneficial. Will ask NP on this.

## 2022-09-08 NOTE — Telephone Encounter (Signed)
I called the pt and relayed recommendation from NP and she verbalized understanding and appreciation for the call. She will let us know how she fares.

## 2022-09-08 NOTE — Telephone Encounter (Signed)
Pt would like a call from  the nurse to discuss increasing carbidopa-levodopa (SINEMET IR) 25-100 MG tablet . Please call cellphone number [phone]

## 2022-09-09 ENCOUNTER — Telehealth: Payer: Self-pay | Admitting: Neurology

## 2022-09-09 NOTE — Telephone Encounter (Signed)
Spoke with patient, informed her that Sinemet is know to cause hypotension and dizziness. Ask her to decrease the dose back to 1 tablet and to gradually increase as tolerate. (Try to increase again tomorrow morning and watch for BP). Informed her that I will forward this message to her provider for additional instructions.

## 2022-09-09 NOTE — Telephone Encounter (Signed)
Pt has called to report that she took the suggested increase of the carbidopa-levodopa (SINEMET IR) 25-100 MG tablet, Pt states her bp since then has been 88/44 and then 102/53 Pt states her husband caught her before a fall last night.  Pt states she did not go to ED, she will reach out to her PCP.  Pt would also like a call from On Call Dr re: what is suggested she do.

## 2022-09-12 NOTE — Telephone Encounter (Signed)
I checked in on the pt. She sts she is currently taking 1.5 of the Car/Levo 25-100 mg and is tolerating well. Since Friday her symptoms have improved and denies low BP values. She continues to be mindful of fluid intake. She reports she is planning on continuing the 1.5 tablets tid at this time. She will update [place] with any issues.

## 2022-09-19 NOTE — Telephone Encounter (Signed)
Pt said, having pain in stomach. Pain stop after eating, but 2 hours after taking carbidopa-levodopa (SINEMET IR) 25-100 MG tabletthe pain starts again.

## 2022-09-19 NOTE — Telephone Encounter (Signed)
I called pt back and we discussed message. She is currently taking Carb/Levo 1.5 am in the morning, 1 midday, 1.5 at 6 pm.  She sts she has been taking this med with her meals and not spacing out the time. She reports eating soon after her medications and stomach pains/cramps start almost instantly.   I advised for this evening to try spacing her med 1 hour before or after she eats (as this could with her symptoms) and I would check in with her tomorrow.  Also advised I would update SS, NP on this message.

## 2022-10-05 DIAGNOSIS — H40013 Open angle with borderline findings, low risk, bilateral: Secondary | ICD-10-CM | POA: Diagnosis not present

## 2022-10-06 ENCOUNTER — Telehealth: Payer: Self-pay | Admitting: Behavioral Health

## 2022-10-06 ENCOUNTER — Telehealth: Payer: Self-pay | Admitting: Neurology

## 2022-10-06 NOTE — Telephone Encounter (Signed)
Husband, Desiree Allen, called to report that Desiree Allen is not doing well at all.  Her medications are not working.  He requested they Zamani be able to see Dr. Clovis Pu as a second opinion.  Desiree Allen also has parkinson and he is not sure if her psychiatric medications are interacting with her other medication.  He just knows that Desiree Allen is not well.  Can't even get out of a chair.  He requested that her care be review by a higher level of care.  She was seen by Dr. Candis Schatz starting in 2007 but was only seen annually from 2009-2016. Last seen 03/2015.  He said her medications were fine then. However that was sometime ago. Her next appt with Aaron Edelman is 01/03/23.  Please call him to let him know what direction to go.

## 2022-10-06 NOTE — Telephone Encounter (Signed)
I am ok with her seeking another opinion if she could be scheduled.  Husband has been in denial about the progression of Parkinson's and advancing age. She is experiencing more cognitive decline and not exhibiting the same personality or behaviors he expects. Planning for assistance and care as decline continues  is also a discussion they need to be having with PCP. I do not feel that any other psychiatric  medications will help.  Dr. Clovis Pu can you offer any other suggestions?

## 2022-10-06 NOTE — Telephone Encounter (Signed)
I will contact pt's husband to discuss.

## 2022-10-06 NOTE — Telephone Encounter (Signed)
Spouse reports that pt is out of it.  He is unsure if it has to do with medications.  Spouse states pt in recliner and is just out of it, he would like a call to discuss in greater detail with RN.  Spouse was asked if he feels he needs to call 911, he states he is unsure, he said pt is able to communicate and she did eat a little breakfast.  Please call

## 2022-10-06 NOTE — Telephone Encounter (Signed)
I called pt's husband back. He sts this morning pt woke up slower than normal and drowsy. He sts each day this week she has been declining.   He then transferred me to the pt and she sts she does not feel like doing anything, no motivation, and increase in depression symptoms.  We rx Carb/Levo 1.5 tid and she is taking Sertraline 200 mg through [facility] ( she also called her provider there this am).  I discussed with SS, NP and she advised to have her f/u with [facility] sx are not likely related to her PD or PD meds.  Advised pt of this and she was agreeable to plan with f/u with [facility].

## 2022-10-06 NOTE — Telephone Encounter (Signed)
Please review

## 2022-10-10 NOTE — Telephone Encounter (Signed)
Noted thanks- I will reach out to her husband and check status, offer Dr. Ardis Rowan if they would prefer second opinion

## 2022-10-10 NOTE — Telephone Encounter (Signed)
Unfortunately my schedule is overwhelmed and I cannot see any new patients.  Consider calling Dr. Casimiro Needle for a second opinion.

## 2022-10-11 NOTE — Telephone Encounter (Signed)
Noted. We can continue to monitor and they can call back with concerns. Thank you.

## 2022-10-11 NOTE — Telephone Encounter (Signed)
Ok, I think that is a good idea. Thanks

## 2022-10-11 NOTE — Telephone Encounter (Signed)
Rtc and pt answered, she sounded good, alert, and was able to answer all my questions. Her and her husband had spoken with neurology last week and they changed how she was taking her Carbidoba(Sinemet). She eats breakfast then waits 1 1/2 hours before taking medication, along with her Zoloft. She was reporting symptoms of not wanting to do anything, no motivation, and depressive symptoms worsening. After the change in timing of medication she reports feeling better. Still having some grogginess in the am but it does wear off. She reports getting around well and no problems at this time. She doesn't feel like she needs anything else at the time of my call. Advised her to have her husband call back with further concerns or questions.

## 2022-10-13 DIAGNOSIS — R300 Dysuria: Secondary | ICD-10-CM | POA: Diagnosis not present

## 2022-10-13 DIAGNOSIS — Z6821 Body mass index (BMI) 21.0-21.9, adult: Secondary | ICD-10-CM | POA: Diagnosis not present

## 2022-10-26 ENCOUNTER — Telehealth: Payer: Self-pay | Admitting: Neurology

## 2022-10-26 NOTE — Telephone Encounter (Signed)
Desiree Denmark, NP spoke with the POD regarding the patient. She would like the patient to be moved up to the next available appointment with Dr. Krista Allen.   I spoke with the patient's husband, Desiree Allen (as per DPR). He verbalized a strong agreement with the plan.  The patient was scheduled for an appointment with Dr. Krista Allen on 11/30/2022. She has also been added to the wait list.

## 2022-10-26 NOTE — Telephone Encounter (Signed)
Pt husband is calling. Stated he would like to talk to a nurse about pt. Stated her health has really gone down hill in the last week. He is requesting a call back from nurse.

## 2022-10-28 DIAGNOSIS — I611 Nontraumatic intracerebral hemorrhage in hemisphere, cortical: Secondary | ICD-10-CM | POA: Diagnosis not present

## 2022-10-28 DIAGNOSIS — G936 Cerebral edema: Secondary | ICD-10-CM | POA: Diagnosis not present

## 2022-10-28 DIAGNOSIS — K5901 Slow transit constipation: Secondary | ICD-10-CM | POA: Diagnosis not present

## 2022-10-28 DIAGNOSIS — I614 Nontraumatic intracerebral hemorrhage in cerebellum: Secondary | ICD-10-CM | POA: Diagnosis not present

## 2022-10-28 DIAGNOSIS — E854 Organ-limited amyloidosis: Secondary | ICD-10-CM | POA: Diagnosis not present

## 2022-10-28 DIAGNOSIS — K5900 Constipation, unspecified: Secondary | ICD-10-CM | POA: Diagnosis not present

## 2022-10-28 DIAGNOSIS — E785 Hyperlipidemia, unspecified: Secondary | ICD-10-CM | POA: Diagnosis not present

## 2022-10-28 DIAGNOSIS — J449 Chronic obstructive pulmonary disease, unspecified: Secondary | ICD-10-CM | POA: Diagnosis not present

## 2022-10-28 DIAGNOSIS — G939 Disorder of brain, unspecified: Secondary | ICD-10-CM | POA: Diagnosis not present

## 2022-10-28 DIAGNOSIS — I951 Orthostatic hypotension: Secondary | ICD-10-CM | POA: Diagnosis not present

## 2022-10-28 DIAGNOSIS — F32A Depression, unspecified: Secondary | ICD-10-CM | POA: Diagnosis not present

## 2022-10-28 DIAGNOSIS — F0284 Dementia in other diseases classified elsewhere, unspecified severity, with anxiety: Secondary | ICD-10-CM | POA: Diagnosis not present

## 2022-10-28 DIAGNOSIS — R410 Disorientation, unspecified: Secondary | ICD-10-CM | POA: Diagnosis not present

## 2022-10-28 DIAGNOSIS — K449 Diaphragmatic hernia without obstruction or gangrene: Secondary | ICD-10-CM | POA: Diagnosis not present

## 2022-10-28 DIAGNOSIS — G9389 Other specified disorders of brain: Secondary | ICD-10-CM | POA: Diagnosis not present

## 2022-10-28 DIAGNOSIS — I619 Nontraumatic intracerebral hemorrhage, unspecified: Secondary | ICD-10-CM | POA: Diagnosis not present

## 2022-10-28 DIAGNOSIS — K760 Fatty (change of) liver, not elsewhere classified: Secondary | ICD-10-CM | POA: Diagnosis not present

## 2022-10-28 DIAGNOSIS — I618 Other nontraumatic intracerebral hemorrhage: Secondary | ICD-10-CM | POA: Diagnosis not present

## 2022-10-28 DIAGNOSIS — F411 Generalized anxiety disorder: Secondary | ICD-10-CM | POA: Diagnosis not present

## 2022-10-28 DIAGNOSIS — Z880 Allergy status to penicillin: Secondary | ICD-10-CM | POA: Diagnosis not present

## 2022-10-28 DIAGNOSIS — N281 Cyst of kidney, acquired: Secondary | ICD-10-CM | POA: Diagnosis not present

## 2022-10-28 DIAGNOSIS — G20A1 Parkinson's disease without dyskinesia, without mention of fluctuations: Secondary | ICD-10-CM | POA: Diagnosis not present

## 2022-10-28 DIAGNOSIS — I517 Cardiomegaly: Secondary | ICD-10-CM | POA: Diagnosis not present

## 2022-10-28 DIAGNOSIS — Z1152 Encounter for screening for COVID-19: Secondary | ICD-10-CM | POA: Diagnosis not present

## 2022-10-28 DIAGNOSIS — F0283 Dementia in other diseases classified elsewhere, unspecified severity, with mood disturbance: Secondary | ICD-10-CM | POA: Diagnosis not present

## 2022-10-28 DIAGNOSIS — I680 Cerebral amyloid angiopathy: Secondary | ICD-10-CM | POA: Diagnosis not present

## 2022-10-28 DIAGNOSIS — I629 Nontraumatic intracranial hemorrhage, unspecified: Secondary | ICD-10-CM | POA: Insufficient documentation

## 2022-10-30 DIAGNOSIS — F32A Depression, unspecified: Secondary | ICD-10-CM | POA: Insufficient documentation

## 2022-10-30 DIAGNOSIS — F411 Generalized anxiety disorder: Secondary | ICD-10-CM | POA: Insufficient documentation

## 2022-11-01 DIAGNOSIS — K5901 Slow transit constipation: Secondary | ICD-10-CM | POA: Insufficient documentation

## 2022-11-07 ENCOUNTER — Telehealth: Payer: Self-pay | Admitting: Neurology

## 2022-11-07 DIAGNOSIS — G936 Cerebral edema: Secondary | ICD-10-CM | POA: Diagnosis not present

## 2022-11-07 DIAGNOSIS — I951 Orthostatic hypotension: Secondary | ICD-10-CM | POA: Diagnosis not present

## 2022-11-07 DIAGNOSIS — I619 Nontraumatic intracerebral hemorrhage, unspecified: Secondary | ICD-10-CM | POA: Diagnosis not present

## 2022-11-07 DIAGNOSIS — F419 Anxiety disorder, unspecified: Secondary | ICD-10-CM | POA: Insufficient documentation

## 2022-11-07 DIAGNOSIS — G20A1 Parkinson's disease without dyskinesia, without mention of fluctuations: Secondary | ICD-10-CM | POA: Diagnosis not present

## 2022-11-07 DIAGNOSIS — I69198 Other sequelae of nontraumatic intracerebral hemorrhage: Secondary | ICD-10-CM | POA: Diagnosis not present

## 2022-11-07 DIAGNOSIS — I959 Hypotension, unspecified: Secondary | ICD-10-CM | POA: Diagnosis not present

## 2022-11-07 DIAGNOSIS — F32A Depression, unspecified: Secondary | ICD-10-CM | POA: Diagnosis not present

## 2022-11-07 DIAGNOSIS — I69118 Other symptoms and signs involving cognitive functions following nontraumatic intracerebral hemorrhage: Secondary | ICD-10-CM | POA: Diagnosis not present

## 2022-11-07 DIAGNOSIS — R2689 Other abnormalities of gait and mobility: Secondary | ICD-10-CM | POA: Diagnosis not present

## 2022-11-07 DIAGNOSIS — E785 Hyperlipidemia, unspecified: Secondary | ICD-10-CM | POA: Diagnosis not present

## 2022-11-07 DIAGNOSIS — R531 Weakness: Secondary | ICD-10-CM | POA: Diagnosis not present

## 2022-11-07 DIAGNOSIS — F411 Generalized anxiety disorder: Secondary | ICD-10-CM | POA: Diagnosis not present

## 2022-11-07 DIAGNOSIS — S0083XA Contusion of other part of head, initial encounter: Secondary | ICD-10-CM | POA: Diagnosis not present

## 2022-11-07 DIAGNOSIS — Z7409 Other reduced mobility: Secondary | ICD-10-CM | POA: Diagnosis not present

## 2022-11-07 DIAGNOSIS — I680 Cerebral amyloid angiopathy: Secondary | ICD-10-CM | POA: Diagnosis not present

## 2022-11-07 DIAGNOSIS — G9389 Other specified disorders of brain: Secondary | ICD-10-CM | POA: Diagnosis not present

## 2022-11-07 DIAGNOSIS — E854 Organ-limited amyloidosis: Secondary | ICD-10-CM | POA: Diagnosis not present

## 2022-11-08 ENCOUNTER — Telehealth: Payer: Self-pay | Admitting: Neurology

## 2022-11-08 NOTE — Telephone Encounter (Signed)
Received discharge summary from [facility], presented to the ER 10/28/2022 with altered mental status after a ground-level fall 1 day prior.  CT head showed left frontal ICH with significant vasogenic edema.  MRI of the brain could not rule out underlying mass.  Evaluated by neurosurgery recommended repeat MRI.  She was started on Keppra, steroids were being weaned.  10/29/2022 MRI of the brain IMPRESSION: Findings consistent with cerebral amyloid angiopathy and a subacute left frontal lobar hematoma.   Follow-up MRI of the brain without and with IV contrast after resolution of the hematoma is suggested in order to exclude the (unlikely) possibility of an underlying neoplasm.   I tried to call the patient to check on her, neither # was available. Will need to get her in for sooner appointment with Dr. Krista Blue for follow-up.

## 2022-11-08 NOTE — Telephone Encounter (Signed)
error 

## 2022-11-09 NOTE — Telephone Encounter (Signed)
Ok to add on for Nov 28th 430pm

## 2022-11-09 NOTE — Telephone Encounter (Signed)
I called the pt's husband. He reports pt released to physical rehab center in [facility]. She is set to be released 11/21/2022 he would like f/u to take place after she is d/c.  I have tentatively scheduled for 11/22/2022 at 430 with Dr. Krista Blue he will call back if this appt needs to be moved. I will keep the on for Dec just in case we have the cancel Nov appt.

## 2022-11-14 DIAGNOSIS — S0083XA Contusion of other part of head, initial encounter: Secondary | ICD-10-CM | POA: Diagnosis not present

## 2022-11-22 ENCOUNTER — Ambulatory Visit: Payer: Medicare PPO | Admitting: Neurology

## 2022-11-29 DIAGNOSIS — D519 Vitamin B12 deficiency anemia, unspecified: Secondary | ICD-10-CM | POA: Diagnosis not present

## 2022-11-29 DIAGNOSIS — E039 Hypothyroidism, unspecified: Secondary | ICD-10-CM | POA: Diagnosis not present

## 2022-11-29 DIAGNOSIS — D508 Other iron deficiency anemias: Secondary | ICD-10-CM | POA: Diagnosis not present

## 2022-11-29 DIAGNOSIS — E559 Vitamin D deficiency, unspecified: Secondary | ICD-10-CM | POA: Diagnosis not present

## 2022-11-29 DIAGNOSIS — E08311 Diabetes mellitus due to underlying condition with unspecified diabetic retinopathy with macular edema: Secondary | ICD-10-CM | POA: Diagnosis not present

## 2022-11-30 ENCOUNTER — Ambulatory Visit: Payer: Medicare PPO | Admitting: Neurology

## 2022-11-30 ENCOUNTER — Encounter: Payer: Self-pay | Admitting: Neurology

## 2022-11-30 VITALS — BP 149/81 | HR 77 | Ht 67.0 in | Wt 143.0 lb

## 2022-11-30 DIAGNOSIS — G20A1 Parkinson's disease without dyskinesia, without mention of fluctuations: Secondary | ICD-10-CM

## 2022-11-30 DIAGNOSIS — I618 Other nontraumatic intracerebral hemorrhage: Secondary | ICD-10-CM

## 2022-11-30 DIAGNOSIS — I629 Nontraumatic intracranial hemorrhage, unspecified: Secondary | ICD-10-CM

## 2022-11-30 DIAGNOSIS — R413 Other amnesia: Secondary | ICD-10-CM

## 2022-11-30 DIAGNOSIS — N3281 Overactive bladder: Secondary | ICD-10-CM | POA: Insufficient documentation

## 2022-11-30 MED ORDER — ALPRAZOLAM 0.5 MG PO TABS: ORAL_TABLET | ORAL | 3 refills | Status: DC

## 2022-11-30 MED ORDER — MELATONIN 1 MG PO TABS: 1.0000 mg | ORAL_TABLET | Freq: Every day | ORAL | 11 refills | Status: DC

## 2022-11-30 NOTE — Progress Notes (Signed)
ASSESSMENT AND PLAN 76 y.o. year old female     Idiopathic Parkinson's disease  Complains of difficulty starting from morning, advised patient take her first dose of Sinemet 25/100 immediate release as soon as she get up, every 4 hours,  Datscan in May 2021 confirmed decreased radiotracer activity at bilateral stratum right worse than left  Left frontal lobar bleeding,  Was treated at [facility], consistent with amyloid angiopathy  Repeat MRI of the brain  Increased confusion, insomnia  Add on melatonin 3 mg every night  Xanax 0.5 mg as needed    DIAGNOSTIC DATA (LABS, IMAGING, TESTING) - I reviewed patient records, labs, notes, testing and imaging myself where available.  Lab in 2022, normal CBC, Hg 14, BMP, creat 1.18, TSH 2.9,  Lipase 39,   2021, normal B12 719  HISTORY OF PRESENT ILLNESS: Desiree Allen is a 76 year old female, seen in request by her primary care physician Little, Lennette Bihari for evaluation of bilateral hands tremor, initial evaluation was on March 25, 2020.   I have reviewed and summarized the referring note from the referring physician.  She has past medical history of anxiety, taking Paxil 40 mg daily, is a retired Airline pilot, she also had a history of right arm melanoma resection in 2012.   I saw her initially in November 2017 for bilateral hands tremor, she did complaints lost sense of smell at that time, also vivid dreams, but no excessive movement during her sleep, there was no family history of tremor.   At that visit, she was noted to have fairly symmetric bilateral hands posturing tremor, was diagnosed with possible essential tremor, she denies significant functional limitation in her daily activity   Over the years, bilateral hands tremor gradually getting worse, now she also complains of mild gait abnormality, especially since her fall accident in November 2020, she also complains of stress incontinence, frequent nocturia, which has  helped by oxybutynin   She continues to notice decreased sense of smell, no REM sleep disorder, she also concerned about her gradual onset mild memory loss,   Today's examination showed clearly parkinsonian features, mild bilateral upper extremity rigidity, bradykinesia, but fairly symmetric resting tremor, decreased bilateral arm swing, ambulate   UPDATE July 8th 2021 She reported 60% improvement after starting Sinemet 25/100 mg 3 times a day, she can walk better, less tremor, she did occasionally noticed end dose wearing off, discussed formal diagnosis of Parkinson's disease   Laboratory evaluation from primary care in September 2020 normal CBC, hemoglobin of 14.2, LDL 87, CMP   Personally reviewed DAT scan May 2021   Decrease radiotracer activity within the LEFT and RIGHT stratum is a pattern suggestive of Parkinson's syndrome pathology.   Greater loss of dopamine transport populations on the RIGHT compared to the LEFT.   She continue complains of mild memory loss, urinary incontinence, hyperreflexia on examination,   UPDATE July 06 2021: She is accompanied by her husband at today's visit, she continue has mild depression anxiety, is coming off Paxil, replace with Zoloft 50 mg daily,  She takes Sinemet at 730am, 1pm, 530pm, she goes to bed 10pm, get up 7am.  Early morning is the most difficult time for her, it is hard for her to start moving, felt slow, sluggish, then gets better as day goes by,  She also complains of worsening low back pain, rating pain to right hip, has slow worsening urinary incontinence, wear depends at night, also have accident, it is hard  for her to stay in public, worry about bladder incontinence,  She is physically active, exercise regularly, busy at her household chores,  TSH, B12 are normal.  We personally reviewed MRI of cervical spine in July 2021, multilevel degenerative changes, no evidence of canal stenosis, variable degree of foraminal narrowing,  most severe at C5-6, C3-4, C4-5  MRI of the brain no acute abnormality, supratentorium small vessel disease  UPDATE Dec 6th 2023: Patient is accompanied by her husband and son for follow-up visit, she was admitted to [facility] for confusion spells, fall, MRI of the brain showed subacute left frontal lobe large hematoma consistent with cerebral amyloid angiopathy, measuring 4.8 x 3.2 x 3.0 cm  She stated rehabilitation, for a while, now is at assisted living, not back to baseline yet, slow reaction time, can ambulate without assistant, agitation at evening difficulty sleeping at nighttime   PHYSICAL EXAM  Vitals:   07/06/21 1100  BP: (!) 179/94  Pulse: 78  Weight: 143 lb 3.2 oz (65 kg)  Height: _0  (1.702 m)   Body mass index is 22.43 kg/m.  Generalized: Well developed, in no acute distress     07/06/2021   12:00 PM  Montreal Cognitive Assessment   Visuospatial/ Executive (0/5) 3  Naming (0/3) 3  Attention: Read list of digits (0/2) 2  Attention: Read list of letters (0/1) 1  Attention: Serial 7 subtraction starting at 100 (0/3) 3  Language: Repeat phrase (0/2) 2  Language : Fluency (0/1) 1  Abstraction (0/2) 2  Delayed Recall (0/5) 3  Orientation (0/6) 6  Total 26    PHYSICAL EXAMNIATION:  Gen: NAD, conversant, well nourised, well groomed        NEUROLOGICAL EXAM:  MENTAL STATUS: Speech/Cognition: Slow reaction time, cooperative on examinations,  CRANIAL NERVES: CN II: Visual fields are full to confrontation.  Pupils are round equal and briskly reactive to light. CN III, IV, VI: extraocular movement are normal. No ptosis. CN V: Facial sensation is intact to light touch. CN VII: Face is symmetric with normal eye closure and smile. CN VIII: Hearing is normal to casual conversation CN IX, X: Palate elevates symmetrically. Phonation is normal. CN XI: Head turning and shoulder shrug are intact  MOTOR: Mild left more than right rigidity,  bradykinesia  REFLEXES: Reflexes are 2+ and symmetric at the biceps, triceps, knees and absent at ankles. Plantar responses are flexor.  SENSORY: Length dependent decreased light touch, vibratory sensation pinprick to above ankle level  COORDINATION: There is no trunk or limb ataxia.    GAIT/STANCE: She can get up from seated position arm crossed, mildly decreased arm swing, fairly steady  REVIEW OF SYSTEMS: Out of a complete 14 system review of symptoms, the patient complains only of the following symptoms, and all other reviewed systems are negative.  Tremor  ALLERGIES: Allergies  Allergen Reactions   Aspirin Other (See Comments)    Cerebral amyloid angiopathy w/ prior spontaneous intracerebral hemorrhage, avoid ALL anticoagulants and antiplatelet medications   Warfarin And Related Other (See Comments)    Cerebral amyloid angiopathy w/ prior spontaneous hemorrhage, avoid ALL anticoagulants and antiplatelet medications   Penicillins Rash    HOME MEDICATIONS: Outpatient Medications Prior to Visit  Medication Sig Dispense Refill   atorvastatin (LIPITOR) 20 MG tablet Take 20 mg by mouth daily.      carbidopa-levodopa (SINEMET IR) 25-100 MG tablet Take 1.5 tablets by mouth 3 (three) times daily. 405 tablet 4   hydrALAZINE (APRESOLINE) 25 MG tablet  Take 25 mg by mouth 2 (two) times daily.     levETIRAcetam (KEPPRA) 500 MG tablet Take 500 mg by mouth 2 (two) times daily.     Multiple Vitamins-Minerals (WOMENS MULTIVITAMIN PO) Take by mouth daily.     Omega-3 Fatty Acids (FISH OIL) 1200 MG CAPS Take 1,200 mg by mouth daily.     sertraline (ZOLOFT) 100 MG tablet Take 2 tablets (200 mg total) by mouth daily. 60 tablet 5   UNABLE TO FIND Med Name: Calmoseptine Ointment     vitamin E 180 MG (400 UNITS) capsule Take 400 Units by mouth daily.     Cholecalciferol (VITAMIN D) 2000 units CAPS Take by mouth daily.     fludrocortisone (FLORINEF) 0.1 MG tablet TAKE ONE TABLET BY MOUTH ONE TIME  DAILY 90 tablet 3   polyethylene glycol (MIRALAX / GLYCOLAX) 17 g packet as needed for constipation.     No facility-administered medications prior to visit.    PAST MEDICAL HISTORY: Past Medical History:  Diagnosis Date   Depression with anxiety    GERD (gastroesophageal reflux disease)    Hyperplastic colon polyp    Melanoma ([place]) 2012   Mixed dyslipidemia    Tremor    Venous insufficiency of both lower extremities    Vitamin D deficiency     PAST SURGICAL HISTORY: Past Surgical History:  Procedure Laterality Date   BUNIONECTOMY Left    CATARACT EXTRACTION Bilateral    MELANOMA EXCISION  2012    FAMILY HISTORY: Family History  Problem Relation Age of Onset   Multiple myeloma Mother    Anemia Father     SOCIAL HISTORY: Social History   Socioeconomic History   Marital status: Married    Spouse name: Roger   Number of children: 3   Years of education: Bachelors   Highest education level: Not on file  Occupational History   Occupation: Retired  Tobacco Use   Smoking status: Never   Smokeless tobacco: Never  Scientific laboratory technician Use: Never used  Substance and Sexual Activity   Alcohol use: No   Drug use: No   Sexual activity: Not on file  Other Topics Concern   Not on file  Social History Narrative   Lives at home with husband.   Left-handed.   No caffeine use.   Social Determinants of Health   Financial Resource Strain: Not on file  Food Insecurity: Not on file  Transportation Needs: Not on file  Physical Activity: Not on file  Stress: Not on file  Social Connections: Not on file  Intimate Partner Violence: Not on file

## 2022-12-01 DIAGNOSIS — F02A3 Dementia in other diseases classified elsewhere, mild, with mood disturbance: Secondary | ICD-10-CM | POA: Diagnosis not present

## 2022-12-01 DIAGNOSIS — F32A Depression, unspecified: Secondary | ICD-10-CM | POA: Diagnosis not present

## 2022-12-01 DIAGNOSIS — G40909 Epilepsy, unspecified, not intractable, without status epilepticus: Secondary | ICD-10-CM | POA: Diagnosis not present

## 2022-12-01 DIAGNOSIS — E785 Hyperlipidemia, unspecified: Secondary | ICD-10-CM | POA: Diagnosis not present

## 2022-12-01 DIAGNOSIS — G20A1 Parkinson's disease without dyskinesia, without mention of fluctuations: Secondary | ICD-10-CM | POA: Diagnosis not present

## 2022-12-01 DIAGNOSIS — I10 Essential (primary) hypertension: Secondary | ICD-10-CM | POA: Diagnosis not present

## 2022-12-11 DIAGNOSIS — N390 Urinary tract infection, site not specified: Secondary | ICD-10-CM | POA: Diagnosis not present

## 2022-12-11 DIAGNOSIS — E08311 Diabetes mellitus due to underlying condition with unspecified diabetic retinopathy with macular edema: Secondary | ICD-10-CM | POA: Diagnosis not present

## 2022-12-15 DIAGNOSIS — I680 Cerebral amyloid angiopathy: Secondary | ICD-10-CM | POA: Diagnosis not present

## 2022-12-15 DIAGNOSIS — I629 Nontraumatic intracranial hemorrhage, unspecified: Secondary | ICD-10-CM | POA: Diagnosis not present

## 2022-12-15 DIAGNOSIS — E854 Organ-limited amyloidosis: Secondary | ICD-10-CM | POA: Diagnosis not present

## 2022-12-15 DIAGNOSIS — G936 Cerebral edema: Secondary | ICD-10-CM | POA: Diagnosis not present

## 2022-12-17 ENCOUNTER — Encounter: Payer: Self-pay | Admitting: Neurology

## 2023-01-02 ENCOUNTER — Ambulatory Visit: Payer: Medicare PPO | Admitting: Neurology

## 2023-01-03 ENCOUNTER — Ambulatory Visit: Payer: Medicare PPO | Admitting: Neurology

## 2023-01-03 ENCOUNTER — Ambulatory Visit: Payer: Medicare PPO | Admitting: Behavioral Health

## 2023-01-06 DIAGNOSIS — R600 Localized edema: Secondary | ICD-10-CM | POA: Diagnosis not present

## 2023-01-06 DIAGNOSIS — N390 Urinary tract infection, site not specified: Secondary | ICD-10-CM | POA: Diagnosis not present

## 2023-01-06 DIAGNOSIS — F03B11 Unspecified dementia, moderate, with agitation: Secondary | ICD-10-CM | POA: Diagnosis not present

## 2023-01-06 DIAGNOSIS — R300 Dysuria: Secondary | ICD-10-CM | POA: Diagnosis not present

## 2023-01-06 DIAGNOSIS — B964 Proteus (mirabilis) (morganii) as the cause of diseases classified elsewhere: Secondary | ICD-10-CM | POA: Diagnosis not present

## 2023-01-06 DIAGNOSIS — G4700 Insomnia, unspecified: Secondary | ICD-10-CM | POA: Diagnosis not present

## 2023-01-16 DIAGNOSIS — F411 Generalized anxiety disorder: Secondary | ICD-10-CM | POA: Diagnosis not present

## 2023-01-16 DIAGNOSIS — E854 Organ-limited amyloidosis: Secondary | ICD-10-CM | POA: Diagnosis not present

## 2023-01-16 DIAGNOSIS — F0284 Dementia in other diseases classified elsewhere, unspecified severity, with anxiety: Secondary | ICD-10-CM | POA: Diagnosis not present

## 2023-01-16 DIAGNOSIS — E785 Hyperlipidemia, unspecified: Secondary | ICD-10-CM | POA: Diagnosis not present

## 2023-01-16 DIAGNOSIS — I951 Orthostatic hypotension: Secondary | ICD-10-CM | POA: Diagnosis not present

## 2023-01-16 DIAGNOSIS — G20A1 Parkinson's disease without dyskinesia, without mention of fluctuations: Secondary | ICD-10-CM | POA: Diagnosis not present

## 2023-01-16 DIAGNOSIS — F329 Major depressive disorder, single episode, unspecified: Secondary | ICD-10-CM | POA: Diagnosis not present

## 2023-01-16 DIAGNOSIS — I680 Cerebral amyloid angiopathy: Secondary | ICD-10-CM | POA: Diagnosis not present

## 2023-01-16 DIAGNOSIS — F0283 Dementia in other diseases classified elsewhere, unspecified severity, with mood disturbance: Secondary | ICD-10-CM | POA: Diagnosis not present

## 2023-01-17 DIAGNOSIS — F0284 Dementia in other diseases classified elsewhere, unspecified severity, with anxiety: Secondary | ICD-10-CM | POA: Diagnosis not present

## 2023-01-17 DIAGNOSIS — F32A Depression, unspecified: Secondary | ICD-10-CM | POA: Diagnosis not present

## 2023-01-17 DIAGNOSIS — F329 Major depressive disorder, single episode, unspecified: Secondary | ICD-10-CM | POA: Diagnosis not present

## 2023-01-17 DIAGNOSIS — E854 Organ-limited amyloidosis: Secondary | ICD-10-CM | POA: Diagnosis not present

## 2023-01-17 DIAGNOSIS — G20A1 Parkinson's disease without dyskinesia, without mention of fluctuations: Secondary | ICD-10-CM | POA: Diagnosis not present

## 2023-01-17 DIAGNOSIS — N182 Chronic kidney disease, stage 2 (mild): Secondary | ICD-10-CM | POA: Diagnosis not present

## 2023-01-17 DIAGNOSIS — I129 Hypertensive chronic kidney disease with stage 1 through stage 4 chronic kidney disease, or unspecified chronic kidney disease: Secondary | ICD-10-CM | POA: Diagnosis not present

## 2023-01-17 DIAGNOSIS — F0283 Dementia in other diseases classified elsewhere, unspecified severity, with mood disturbance: Secondary | ICD-10-CM | POA: Diagnosis not present

## 2023-01-17 DIAGNOSIS — F411 Generalized anxiety disorder: Secondary | ICD-10-CM | POA: Diagnosis not present

## 2023-01-17 DIAGNOSIS — I680 Cerebral amyloid angiopathy: Secondary | ICD-10-CM | POA: Diagnosis not present

## 2023-01-17 DIAGNOSIS — I951 Orthostatic hypotension: Secondary | ICD-10-CM | POA: Diagnosis not present

## 2023-01-17 DIAGNOSIS — E785 Hyperlipidemia, unspecified: Secondary | ICD-10-CM | POA: Diagnosis not present

## 2023-01-17 DIAGNOSIS — G40909 Epilepsy, unspecified, not intractable, without status epilepticus: Secondary | ICD-10-CM | POA: Diagnosis not present

## 2023-01-21 DIAGNOSIS — I680 Cerebral amyloid angiopathy: Secondary | ICD-10-CM | POA: Diagnosis not present

## 2023-01-21 DIAGNOSIS — G20A1 Parkinson's disease without dyskinesia, without mention of fluctuations: Secondary | ICD-10-CM | POA: Diagnosis not present

## 2023-01-21 DIAGNOSIS — I69198 Other sequelae of nontraumatic intracerebral hemorrhage: Secondary | ICD-10-CM | POA: Diagnosis not present

## 2023-01-21 DIAGNOSIS — I959 Hypotension, unspecified: Secondary | ICD-10-CM | POA: Diagnosis not present

## 2023-01-23 DIAGNOSIS — G20A1 Parkinson's disease without dyskinesia, without mention of fluctuations: Secondary | ICD-10-CM | POA: Diagnosis not present

## 2023-01-23 DIAGNOSIS — F411 Generalized anxiety disorder: Secondary | ICD-10-CM | POA: Diagnosis not present

## 2023-01-23 DIAGNOSIS — F0284 Dementia in other diseases classified elsewhere, unspecified severity, with anxiety: Secondary | ICD-10-CM | POA: Diagnosis not present

## 2023-01-23 DIAGNOSIS — F0283 Dementia in other diseases classified elsewhere, unspecified severity, with mood disturbance: Secondary | ICD-10-CM | POA: Diagnosis not present

## 2023-01-23 DIAGNOSIS — E785 Hyperlipidemia, unspecified: Secondary | ICD-10-CM | POA: Diagnosis not present

## 2023-01-23 DIAGNOSIS — F329 Major depressive disorder, single episode, unspecified: Secondary | ICD-10-CM | POA: Diagnosis not present

## 2023-01-23 DIAGNOSIS — I680 Cerebral amyloid angiopathy: Secondary | ICD-10-CM | POA: Diagnosis not present

## 2023-01-23 DIAGNOSIS — E854 Organ-limited amyloidosis: Secondary | ICD-10-CM | POA: Diagnosis not present

## 2023-01-23 DIAGNOSIS — I951 Orthostatic hypotension: Secondary | ICD-10-CM | POA: Diagnosis not present

## 2023-01-24 DIAGNOSIS — F329 Major depressive disorder, single episode, unspecified: Secondary | ICD-10-CM | POA: Diagnosis not present

## 2023-01-24 DIAGNOSIS — F411 Generalized anxiety disorder: Secondary | ICD-10-CM | POA: Diagnosis not present

## 2023-01-24 DIAGNOSIS — E854 Organ-limited amyloidosis: Secondary | ICD-10-CM | POA: Diagnosis not present

## 2023-01-24 DIAGNOSIS — F0284 Dementia in other diseases classified elsewhere, unspecified severity, with anxiety: Secondary | ICD-10-CM | POA: Diagnosis not present

## 2023-01-24 DIAGNOSIS — F0283 Dementia in other diseases classified elsewhere, unspecified severity, with mood disturbance: Secondary | ICD-10-CM | POA: Diagnosis not present

## 2023-01-24 DIAGNOSIS — E785 Hyperlipidemia, unspecified: Secondary | ICD-10-CM | POA: Diagnosis not present

## 2023-01-24 DIAGNOSIS — I680 Cerebral amyloid angiopathy: Secondary | ICD-10-CM | POA: Diagnosis not present

## 2023-01-24 DIAGNOSIS — G20A1 Parkinson's disease without dyskinesia, without mention of fluctuations: Secondary | ICD-10-CM | POA: Diagnosis not present

## 2023-01-24 DIAGNOSIS — I951 Orthostatic hypotension: Secondary | ICD-10-CM | POA: Diagnosis not present

## 2023-01-31 DIAGNOSIS — I951 Orthostatic hypotension: Secondary | ICD-10-CM | POA: Diagnosis not present

## 2023-01-31 DIAGNOSIS — I680 Cerebral amyloid angiopathy: Secondary | ICD-10-CM | POA: Diagnosis not present

## 2023-01-31 DIAGNOSIS — G20A1 Parkinson's disease without dyskinesia, without mention of fluctuations: Secondary | ICD-10-CM | POA: Diagnosis not present

## 2023-01-31 DIAGNOSIS — E854 Organ-limited amyloidosis: Secondary | ICD-10-CM | POA: Diagnosis not present

## 2023-01-31 DIAGNOSIS — F329 Major depressive disorder, single episode, unspecified: Secondary | ICD-10-CM | POA: Diagnosis not present

## 2023-01-31 DIAGNOSIS — F411 Generalized anxiety disorder: Secondary | ICD-10-CM | POA: Diagnosis not present

## 2023-01-31 DIAGNOSIS — I10 Essential (primary) hypertension: Secondary | ICD-10-CM | POA: Diagnosis not present

## 2023-01-31 DIAGNOSIS — F0283 Dementia in other diseases classified elsewhere, unspecified severity, with mood disturbance: Secondary | ICD-10-CM | POA: Diagnosis not present

## 2023-01-31 DIAGNOSIS — F0284 Dementia in other diseases classified elsewhere, unspecified severity, with anxiety: Secondary | ICD-10-CM | POA: Diagnosis not present

## 2023-01-31 DIAGNOSIS — R600 Localized edema: Secondary | ICD-10-CM | POA: Diagnosis not present

## 2023-01-31 DIAGNOSIS — E785 Hyperlipidemia, unspecified: Secondary | ICD-10-CM | POA: Diagnosis not present

## 2023-02-01 DIAGNOSIS — E785 Hyperlipidemia, unspecified: Secondary | ICD-10-CM | POA: Diagnosis not present

## 2023-02-01 DIAGNOSIS — F0284 Dementia in other diseases classified elsewhere, unspecified severity, with anxiety: Secondary | ICD-10-CM | POA: Diagnosis not present

## 2023-02-01 DIAGNOSIS — I951 Orthostatic hypotension: Secondary | ICD-10-CM | POA: Diagnosis not present

## 2023-02-01 DIAGNOSIS — F329 Major depressive disorder, single episode, unspecified: Secondary | ICD-10-CM | POA: Diagnosis not present

## 2023-02-01 DIAGNOSIS — G20A1 Parkinson's disease without dyskinesia, without mention of fluctuations: Secondary | ICD-10-CM | POA: Diagnosis not present

## 2023-02-01 DIAGNOSIS — F411 Generalized anxiety disorder: Secondary | ICD-10-CM | POA: Diagnosis not present

## 2023-02-01 DIAGNOSIS — E854 Organ-limited amyloidosis: Secondary | ICD-10-CM | POA: Diagnosis not present

## 2023-02-01 DIAGNOSIS — F0283 Dementia in other diseases classified elsewhere, unspecified severity, with mood disturbance: Secondary | ICD-10-CM | POA: Diagnosis not present

## 2023-02-01 DIAGNOSIS — I680 Cerebral amyloid angiopathy: Secondary | ICD-10-CM | POA: Diagnosis not present

## 2023-02-07 DIAGNOSIS — I10 Essential (primary) hypertension: Secondary | ICD-10-CM | POA: Diagnosis not present

## 2023-02-07 DIAGNOSIS — I951 Orthostatic hypotension: Secondary | ICD-10-CM | POA: Diagnosis not present

## 2023-02-07 DIAGNOSIS — R600 Localized edema: Secondary | ICD-10-CM | POA: Diagnosis not present

## 2023-02-07 DIAGNOSIS — G20A1 Parkinson's disease without dyskinesia, without mention of fluctuations: Secondary | ICD-10-CM | POA: Diagnosis not present

## 2023-02-08 DIAGNOSIS — G20A1 Parkinson's disease without dyskinesia, without mention of fluctuations: Secondary | ICD-10-CM | POA: Diagnosis not present

## 2023-02-08 DIAGNOSIS — F329 Major depressive disorder, single episode, unspecified: Secondary | ICD-10-CM | POA: Diagnosis not present

## 2023-02-08 DIAGNOSIS — I951 Orthostatic hypotension: Secondary | ICD-10-CM | POA: Diagnosis not present

## 2023-02-08 DIAGNOSIS — I680 Cerebral amyloid angiopathy: Secondary | ICD-10-CM | POA: Diagnosis not present

## 2023-02-08 DIAGNOSIS — E785 Hyperlipidemia, unspecified: Secondary | ICD-10-CM | POA: Diagnosis not present

## 2023-02-08 DIAGNOSIS — F0284 Dementia in other diseases classified elsewhere, unspecified severity, with anxiety: Secondary | ICD-10-CM | POA: Diagnosis not present

## 2023-02-08 DIAGNOSIS — F411 Generalized anxiety disorder: Secondary | ICD-10-CM | POA: Diagnosis not present

## 2023-02-08 DIAGNOSIS — F0283 Dementia in other diseases classified elsewhere, unspecified severity, with mood disturbance: Secondary | ICD-10-CM | POA: Diagnosis not present

## 2023-02-08 DIAGNOSIS — E854 Organ-limited amyloidosis: Secondary | ICD-10-CM | POA: Diagnosis not present

## 2023-02-09 ENCOUNTER — Encounter (HOSPITAL_COMMUNITY): Payer: Self-pay

## 2023-02-09 ENCOUNTER — Emergency Department (HOSPITAL_COMMUNITY): Payer: Medicare PPO

## 2023-02-09 ENCOUNTER — Emergency Department (HOSPITAL_COMMUNITY)
Admission: EM | Admit: 2023-02-09 | Discharge: 2023-02-10 | Disposition: A | Discharge status: Rehabilitation Facility | Payer: Medicare PPO | Attending: Emergency Medicine | Admitting: Emergency Medicine

## 2023-02-09 ENCOUNTER — Other Ambulatory Visit: Payer: Self-pay

## 2023-02-09 DIAGNOSIS — S51811A Laceration without foreign body of right forearm, initial encounter: Secondary | ICD-10-CM

## 2023-02-09 DIAGNOSIS — E785 Hyperlipidemia, unspecified: Secondary | ICD-10-CM | POA: Diagnosis not present

## 2023-02-09 DIAGNOSIS — I951 Orthostatic hypotension: Secondary | ICD-10-CM | POA: Diagnosis not present

## 2023-02-09 DIAGNOSIS — M25521 Pain in right elbow: Secondary | ICD-10-CM | POA: Insufficient documentation

## 2023-02-09 DIAGNOSIS — F0390 Unspecified dementia without behavioral disturbance: Secondary | ICD-10-CM | POA: Diagnosis not present

## 2023-02-09 DIAGNOSIS — W19XXXA Unspecified fall, initial encounter: Secondary | ICD-10-CM | POA: Diagnosis not present

## 2023-02-09 DIAGNOSIS — G20C Parkinsonism, unspecified: Secondary | ICD-10-CM | POA: Insufficient documentation

## 2023-02-09 DIAGNOSIS — F411 Generalized anxiety disorder: Secondary | ICD-10-CM | POA: Diagnosis not present

## 2023-02-09 DIAGNOSIS — Z043 Encounter for examination and observation following other accident: Secondary | ICD-10-CM | POA: Diagnosis not present

## 2023-02-09 DIAGNOSIS — I680 Cerebral amyloid angiopathy: Secondary | ICD-10-CM | POA: Diagnosis not present

## 2023-02-09 DIAGNOSIS — F329 Major depressive disorder, single episode, unspecified: Secondary | ICD-10-CM | POA: Diagnosis not present

## 2023-02-09 DIAGNOSIS — F0284 Dementia in other diseases classified elsewhere, unspecified severity, with anxiety: Secondary | ICD-10-CM | POA: Diagnosis not present

## 2023-02-09 DIAGNOSIS — G20A1 Parkinson's disease without dyskinesia, without mention of fluctuations: Secondary | ICD-10-CM | POA: Diagnosis not present

## 2023-02-09 DIAGNOSIS — S0990XA Unspecified injury of head, initial encounter: Secondary | ICD-10-CM | POA: Diagnosis not present

## 2023-02-09 DIAGNOSIS — F0283 Dementia in other diseases classified elsewhere, unspecified severity, with mood disturbance: Secondary | ICD-10-CM | POA: Diagnosis not present

## 2023-02-09 DIAGNOSIS — R42 Dizziness and giddiness: Secondary | ICD-10-CM | POA: Diagnosis present

## 2023-02-09 DIAGNOSIS — E854 Organ-limited amyloidosis: Secondary | ICD-10-CM | POA: Diagnosis not present

## 2023-02-09 DIAGNOSIS — I69314 Frontal lobe and executive function deficit following cerebral infarction: Secondary | ICD-10-CM | POA: Diagnosis not present

## 2023-02-09 MED ORDER — ACETAMINOPHEN 325 MG PO TABS: 650.0000 mg | ORAL_TABLET | Freq: Once | ORAL | Status: DC

## 2023-02-09 NOTE — ED Triage Notes (Signed)
Pt arrived by EMS from [facility] after an unwitnessed fall out of bed. Pt is complaining of all over pain but isnt able to tell staff if she was having this pain prior to fall or if that caused the fall.  Pt has a hx of dementia and is oriented only to self  Pt has a small skin tear on right forearm, bleeding is controlled.  Pt has hx of left frontal lobe hemmorage.

## 2023-02-09 NOTE — ED Provider Notes (Incomplete)
[facility] Provider Note   CSN: [id number] Arrival date & time: 02/09/23  2239     History {Add pertinent medical, surgical, social history, OB history to HPI:1} Chief Complaint  Patient presents with  . Fall    Marshal A Carandang is a 77 y.o. female.   Fall       Home Medications Prior to Admission medications   Medication Sig Start Date End Date Taking? Authorizing Provider  ALPRAZolam Duanne Moron) 0.5 MG tablet Every 12 hours as needed for agitation 11/30/22   Marcial Pacas, MD  atorvastatin (LIPITOR) 20 MG tablet Take 20 mg by mouth daily.  09/29/16   [provider]  carbidopa-levodopa (SINEMET IR) 25-100 MG tablet Take 1.5 tablets by mouth 3 (three) times daily. 09/08/22   Suzzanne Cloud, NP  hydrALAZINE (APRESOLINE) 25 MG tablet Take 25 mg by mouth 2 (two) times daily. 11/14/22   [provider]  levETIRAcetam (KEPPRA) 500 MG tablet Take 500 mg by mouth 2 (two) times daily.    [provider]  melatonin 1 MG TABS tablet Take 1 tablet (1 mg total) by mouth at bedtime. 11/30/22   Marcial Pacas, MD  melatonin 1 MG TABS tablet Take 1 tablet (1 mg total) by mouth at bedtime. 11/30/22   Marcial Pacas, MD  Multiple Vitamins-Minerals (WOMENS MULTIVITAMIN PO) Take by mouth daily.    [provider]  Omega-3 Fatty Acids (FISH OIL) 1200 MG CAPS Take 1,200 mg by mouth daily.    [provider]  sertraline (ZOLOFT) 100 MG tablet Take 2 tablets (200 mg total) by mouth daily. 08/02/22   Elwanda Brooklyn, NP  UNABLE TO FIND Med Name: Calmoseptine Ointment    [provider]  vitamin E 180 MG (400 UNITS) capsule Take 400 Units by mouth daily.    [provider]      Allergies    Aspirin, Warfarin and related, and Penicillins    Review of Systems   Review of Systems  Physical Exam Updated Vital Signs BP 123/86 (BP Location: Left Arm)   Pulse 80   Temp 97.7 F (36.5 C) (Oral)   Ht '5\' 7"'$  (1.702 m)    Wt 65 kg   SpO2 98%   BMI 22.44 kg/m  Physical Exam  ED Results / Procedures / Treatments   Labs (all labs ordered are listed, but only abnormal results are displayed) Labs Reviewed  COMPREHENSIVE METABOLIC PANEL  CBC WITH DIFFERENTIAL/PLATELET  URINALYSIS, ROUTINE W REFLEX MICROSCOPIC    EKG None  Radiology No results found.  Procedures Procedures  {Document cardiac monitor, telemetry assessment procedure when appropriate:1}  Medications Ordered in ED Medications  acetaminophen (TYLENOL) tablet 650 mg (has no administration in time range)    ED Course/ Medical Decision Making/ A&P   {   Click here for ABCD2, HEART and other calculatorsREFRESH Note before signing :1}                          Medical Decision Making Amount and/or Complexity of Data Reviewed Labs: ordered. Radiology: ordered.  Risk OTC drugs.   ***  {Document critical care time when appropriate:1} {Document review of labs and clinical decision tools ie heart score, Chads2Vasc2 etc:1}  {Document your independent review of radiology images, and any outside records:1} {Document your discussion with family members, caretakers, and with consultants:1} {Document social determinants of health affecting pt's care:1} {Document your decision making why  or why not admission, treatments were needed:1} Final Clinical Impression(s) / ED Diagnoses Final diagnoses:  None    Rx / DC Orders ED Discharge Orders     None

## 2023-02-09 NOTE — ED Provider Notes (Signed)
[facility] Provider Note   CSN: [id number] Arrival date & time: 02/09/23  2239     History  Chief Complaint  Patient presents with   Fall    Desiree Allen is a 77 y.o. female.  Patient is a 77 year old female with a past medical history ICH, dementia ANO x 1 at baseline, Parkinson's presenting to the emergency department after a fall.  Patient is here with her husband who states that this facility where she lives told him that she had a fall this morning and then found her again in the bathroom this evening.  Patient reports that she was trying to get up on her own without assistance but is unable to provide additional history if she is feeling dizzy before the fall.  Patient's husband states that she is had a good and bad days and has been more confused with more frequent falls recently.  He denies any recent fevers, nausea, vomiting or diarrhea.  He denies any blood thinner use.  Patient ambulates with a walker at baseline.  The history is provided by the spouse. The history is limited by the condition of the patient (Dementia).  Fall       Home Medications Prior to Admission medications   Medication Sig Start Date End Date Taking? Authorizing Provider  ALPRAZolam Duanne Moron) 0.5 MG tablet Every 12 hours as needed for agitation 11/30/22   Marcial Pacas, MD  atorvastatin (LIPITOR) 20 MG tablet Take 20 mg by mouth daily.  09/29/16   [provider]  carbidopa-levodopa (SINEMET IR) 25-100 MG tablet Take 1.5 tablets by mouth 3 (three) times daily. 09/08/22   Suzzanne Cloud, NP  hydrALAZINE (APRESOLINE) 25 MG tablet Take 25 mg by mouth 2 (two) times daily. 11/14/22   [provider]  levETIRAcetam (KEPPRA) 500 MG tablet Take 500 mg by mouth 2 (two) times daily.    [provider]  melatonin 1 MG TABS tablet Take 1 tablet (1 mg total) by mouth at bedtime. 11/30/22   Marcial Pacas, MD  melatonin 1 MG TABS tablet Take 1 tablet (1  mg total) by mouth at bedtime. 11/30/22   Marcial Pacas, MD  Multiple Vitamins-Minerals (WOMENS MULTIVITAMIN PO) Take by mouth daily.    [provider]  Omega-3 Fatty Acids (FISH OIL) 1200 MG CAPS Take 1,200 mg by mouth daily.    [provider]  sertraline (ZOLOFT) 100 MG tablet Take 2 tablets (200 mg total) by mouth daily. 08/02/22   Elwanda Brooklyn, NP  UNABLE TO FIND Med Name: Calmoseptine Ointment    [provider]  vitamin E 180 MG (400 UNITS) capsule Take 400 Units by mouth daily.    [provider]      Allergies    Aspirin, Warfarin and related, and Penicillins    Review of Systems   Review of Systems  Physical Exam Updated Vital Signs BP 123/86 (BP Location: Left Arm)   Pulse 80   Temp 97.7 F (36.5 C) (Oral)   Ht 5' 7"$  (1.702 m)   Wt 65 kg   SpO2 98%   BMI 22.44 kg/m  Physical Exam Vitals and nursing note reviewed.  Constitutional:      General: She is not in acute distress.    Appearance: Normal appearance.  HENT:     Head: Normocephalic and atraumatic.     Nose: Nose normal.     Mouth/Throat:     Mouth: Mucous membranes are  moist.     Pharynx: Oropharynx is clear.  Eyes:     Extraocular Movements: Extraocular movements intact.     Conjunctiva/sclera: Conjunctivae normal.     Pupils: Pupils are equal, round, and reactive to light.  Neck:     Comments: No midline neck tenderness Cardiovascular:     Rate and Rhythm: Normal rate and regular rhythm.     Heart sounds: Normal heart sounds.  Pulmonary:     Effort: Pulmonary effort is normal.     Breath sounds: Normal breath sounds.  Abdominal:     General: Abdomen is flat.     Palpations: Abdomen is soft.     Tenderness: There is no abdominal tenderness.  Musculoskeletal:        General: Normal range of motion.     Cervical back: Normal range of motion and neck supple.     Comments: No midline back tenderness Tenderness to palpation of R elbow, no bony deformity or  swelling No tenderness to palpation of LUE Pelvis stable, non-tender No tenderness to palpation of bilateral LE  Skin:    General: Skin is warm and dry.     Comments: Skin tear, non-bleeding, to R forearm  Neurological:     General: No focal deficit present.     Mental Status: She is alert. Mental status is at baseline.     Sensory: No sensory deficit.     Motor: No weakness.  Psychiatric:        Mood and Affect: Mood normal.        Behavior: Behavior normal.     ED Results / Procedures / Treatments   Labs (all labs ordered are listed, but only abnormal results are displayed) Labs Reviewed  COMPREHENSIVE METABOLIC PANEL  CBC WITH DIFFERENTIAL/PLATELET  URINALYSIS, ROUTINE W REFLEX MICROSCOPIC    EKG None  Radiology DG Elbow 2 Views Right  Result Date: 02/10/2023 CLINICAL DATA:  Right elbow pain after unwitnessed fall. EXAM: RIGHT ELBOW - 2 VIEW COMPARISON:  None Available. FINDINGS: Degenerative changes in the right elbow. No evidence of acute fracture or dislocation. No focal bone lesions. No significant effusions. Soft tissues are unremarkable. IMPRESSION: Degenerative changes.  No acute bony abnormalities. Electronically Signed   By: Lucienne Capers M.D.   On: 02/10/2023 00:03   DG Chest 1 View  Result Date: 02/10/2023 CLINICAL DATA:  Unwitnessed fall. EXAM: CHEST  1 VIEW COMPARISON:  02/03/2021 FINDINGS: Patient is rotated. Heart size and pulmonary vascularity are normal. Lungs are clear. No pleural effusions. No pneumothorax. Mediastinal contours appear intact. Degenerative changes in the spine. IMPRESSION: No active disease. Electronically Signed   By: Lucienne Capers M.D.   On: 02/10/2023 00:01    Procedures Procedures    Medications Ordered in ED Medications  acetaminophen (TYLENOL) tablet 650 mg (has no administration in time range)    ED Course/ Medical Decision Making/ A&P Clinical Course as of 02/10/23 0015  Fri Feb 10, 2023  0014 Patient signed out to  Dr. Sedonia Small pending work up and disposition. [VK]    Clinical Course User Index [VK] Kemper Durie, DO                             Medical Decision Making This patient presents to the ED with chief complaint(s) of fall with pertinent past medical history of dementia, ICH, Parkinson's which further complicates the presenting complaint. The complaint involves an extensive differential diagnosis and also  carries with it a high risk of complications and morbidity.    The differential diagnosis includes ICH, mass effect, cervical spine injury, elbow fracture, no other traumatic injuries seen on exam, due to patient's dementia and unreliable history, concern for possible syncopal fall, arrhythmia, anemia, electrolyte abnormality, dehydration, infection  Additional history obtained: Additional history obtained from spouse Records reviewed [place]  ED Course and Reassessment: Patient was awake and well-appearing on arrival no acute distress.  Due to patient's fall and advanced age, she will have head CT and C-spine performed to evaluate for traumatic injury as well as a elbow x-ray.  Labs and EKG will be performed to evaluate for possible cause of her fall and she will be closely reassessed.      Amount and/or Complexity of Data Reviewed Labs: ordered. Radiology: ordered.  Risk OTC drugs.          Final Clinical Impression(s) / ED Diagnoses Final diagnoses:  None    Rx / DC Orders ED Discharge Orders     None         Kemper Durie, DO 02/10/23 0015

## 2023-02-10 ENCOUNTER — Other Ambulatory Visit (HOSPITAL_COMMUNITY): Payer: Medicare PPO

## 2023-02-10 ENCOUNTER — Emergency Department (HOSPITAL_COMMUNITY): Payer: Medicare PPO

## 2023-02-10 DIAGNOSIS — Z043 Encounter for examination and observation following other accident: Secondary | ICD-10-CM | POA: Diagnosis not present

## 2023-02-10 DIAGNOSIS — I69314 Frontal lobe and executive function deficit following cerebral infarction: Secondary | ICD-10-CM | POA: Diagnosis not present

## 2023-02-10 LAB — URINALYSIS, ROUTINE W REFLEX MICROSCOPIC
Bilirubin Urine: NEGATIVE
Glucose, UA: NEGATIVE mg/dL
Hgb urine dipstick: NEGATIVE
Ketones, ur: NEGATIVE mg/dL
Nitrite: NEGATIVE
Protein, ur: NEGATIVE mg/dL
Specific Gravity, Urine: 1.011 (ref 1.005–1.030)
pH: 6 (ref 5.0–8.0)

## 2023-02-10 LAB — CBC WITH DIFFERENTIAL/PLATELET
Abs Immature Granulocytes: 0.02 10*3/uL (ref 0.00–0.07)
Basophils Absolute: 0 10*3/uL (ref 0.0–0.1)
Basophils Relative: 0 %
Eosinophils Absolute: 0.2 10*3/uL (ref 0.0–0.5)
Eosinophils Relative: 3 %
HCT: 38 % (ref 36.0–46.0)
Hemoglobin: 13 g/dL (ref 12.0–15.0)
Immature Granulocytes: 0 %
Lymphocytes Relative: 28 %
Lymphs Abs: 2.2 10*3/uL (ref 0.7–4.0)
MCH: 31.1 pg (ref 26.0–34.0)
MCHC: 34.2 g/dL (ref 30.0–36.0)
MCV: 90.9 fL (ref 80.0–100.0)
Monocytes Absolute: 0.8 10*3/uL (ref 0.1–1.0)
Monocytes Relative: 10 %
Neutro Abs: 4.7 10*3/uL (ref 1.7–7.7)
Neutrophils Relative %: 59 %
Platelets: 198 10*3/uL (ref 150–400)
RBC: 4.18 MIL/uL (ref 3.87–5.11)
RDW: 12.9 % (ref 11.5–15.5)
WBC: 8 10*3/uL (ref 4.0–10.5)
nRBC: 0 % (ref 0.0–0.2)

## 2023-02-10 LAB — COMPREHENSIVE METABOLIC PANEL
ALT: 10 U/L (ref 0–44)
AST: 31 U/L (ref 15–41)
Albumin: 3.5 g/dL (ref 3.5–5.0)
Alkaline Phosphatase: 92 U/L (ref 38–126)
Anion gap: 12 (ref 5–15)
BUN: 44 mg/dL — ABNORMAL HIGH (ref 8–23)
CO2: 33 mmol/L — ABNORMAL HIGH (ref 22–32)
Calcium: 9.2 mg/dL (ref 8.9–10.3)
Chloride: 94 mmol/L — ABNORMAL LOW (ref 98–111)
Creatinine, Ser: 1.46 mg/dL — ABNORMAL HIGH (ref 0.44–1.00)
GFR, Estimated: 37 mL/min — ABNORMAL LOW (ref >60)
Glucose, Bld: 118 mg/dL — ABNORMAL HIGH (ref 70–99)
Potassium: 2.6 mmol/L — CL (ref 3.5–5.1)
Sodium: 139 mmol/L (ref 135–145)
Total Bilirubin: 0.4 mg/dL (ref 0.3–1.2)
Total Protein: 6.2 g/dL — ABNORMAL LOW (ref 6.5–8.1)

## 2023-02-10 MED ORDER — LACTATED RINGERS IV BOLUS
1000.0000 mL | Freq: Once | INTRAVENOUS | Status: AC
  Administered 2023-02-10: 1000 mL via INTRAVENOUS

## 2023-02-10 MED ORDER — POTASSIUM CHLORIDE 10 MEQ/100ML IV SOLN
10.0000 meq | INTRAVENOUS | Status: AC
  Administered 2023-02-10 (×3): 10 meq via INTRAVENOUS
  Filled 2023-02-10 (×3): qty 100

## 2023-02-10 MED ORDER — POTASSIUM & SODIUM PHOSPHATES 280-160-250 MG PO PACK
1.0000 | PACK | Freq: Three times a day (TID) | ORAL | Status: DC
  Filled 2023-02-10 (×3): qty 1

## 2023-02-10 MED ORDER — POTASSIUM CHLORIDE CRYS ER 20 MEQ PO TBCR: 20.0000 meq | EXTENDED_RELEASE_TABLET | Freq: Two times a day (BID) | ORAL | 0 refills | Status: DC

## 2023-02-10 MED ORDER — POTASSIUM CHLORIDE CRYS ER 20 MEQ PO TBCR: 20.0000 meq | EXTENDED_RELEASE_TABLET | Freq: Two times a day (BID) | ORAL | 0 refills | Status: AC

## 2023-02-10 NOTE — ED Notes (Signed)
Pt assisted to car by staff. Driven to [employer] by husband. AVS and prescription handed to husband.,

## 2023-02-10 NOTE — ED Notes (Signed)
Pt brief saturated. Linens and brief changed

## 2023-02-10 NOTE — ED Notes (Signed)
Date and time results received: 02/10/23 0108 (use smartphrase ".now" to insert current time)  Test: potassium Critical Value: 2.6  Name of Provider Notified: Viona Gilmore, MD

## 2023-02-10 NOTE — ED Provider Notes (Addendum)
  Provider Note MRN:  [id number]  Arrival date & time: 02/10/23    ED Course and Medical Decision Making  Assumed care from Dr. Maylon Peppers at shift change.  Unwitnessed fall at facility, suspicion that patient tried to get out of bed on her own.  Elbow pain.  History of subdural hematoma.  Awaiting x-rays, CT, basic labs, urinalysis, EKG.  If reassuring workup patient would be a candidate for discharge back to facility.  Some potassium for the noted hypokalemia.  Also a bit dehydrated, providing IV fluids.  Plan is for discharge. Procedures  Final Clinical Impressions(s) / ED Diagnoses     ICD-10-CM   1. Fall, initial encounter  W19.XXXA     2. Skin tear of right forearm without complication, initial encounter  S51.811A       ED Discharge Orders          Ordered    potassium chloride SA (KLOR-CON M) 20 MEQ tablet  2 times daily,   Status:  Discontinued        02/10/23 0449    potassium chloride SA (KLOR-CON M) 20 MEQ tablet  2 times daily        02/10/23 0449              Discharge Instructions      You were evaluated in the Emergency Department and after careful evaluation, we did not find any emergent condition requiring admission or further testing in the hospital.  Your exam/testing today is overall reassuring.  Your blood testing has shown some evidence of dehydration.  Please drink plenty of fluids and have a balanced diet.  Your potassium level was low.  Take the potassium tablets as directed.  Please return to the Emergency Department if you experience any worsening of your condition.   Thank you for allowing [place] to be a part of your care.      Barth Kirks. Sedonia Small, [place] [email or web address]    Maudie Flakes, MD 02/10/23 0036    Maudie Flakes, MD 02/10/23 [phone]

## 2023-02-10 NOTE — Discharge Instructions (Signed)
You were evaluated in the Emergency Department and after careful evaluation, we did not find any emergent condition requiring admission or further testing in the hospital.  Your exam/testing today is overall reassuring.  Your blood testing has shown some evidence of dehydration.  Please drink plenty of fluids and have a balanced diet.  Your potassium level was low.  Take the potassium tablets as directed.  Please return to the Emergency Department if you experience any worsening of your condition.   Thank you for allowing [place] to be a part of your care.

## 2023-02-10 NOTE — ED Notes (Signed)
RN called report to [place] at [facility]

## 2023-02-14 DIAGNOSIS — I10 Essential (primary) hypertension: Secondary | ICD-10-CM | POA: Diagnosis not present

## 2023-02-14 DIAGNOSIS — G20A1 Parkinson's disease without dyskinesia, without mention of fluctuations: Secondary | ICD-10-CM | POA: Diagnosis not present

## 2023-02-14 DIAGNOSIS — W19XXXA Unspecified fall, initial encounter: Secondary | ICD-10-CM | POA: Diagnosis not present

## 2023-02-14 DIAGNOSIS — M25522 Pain in left elbow: Secondary | ICD-10-CM | POA: Diagnosis not present

## 2023-02-14 DIAGNOSIS — I951 Orthostatic hypotension: Secondary | ICD-10-CM | POA: Diagnosis not present

## 2023-02-16 DIAGNOSIS — I680 Cerebral amyloid angiopathy: Secondary | ICD-10-CM | POA: Diagnosis not present

## 2023-02-16 DIAGNOSIS — I951 Orthostatic hypotension: Secondary | ICD-10-CM | POA: Diagnosis not present

## 2023-02-16 DIAGNOSIS — F329 Major depressive disorder, single episode, unspecified: Secondary | ICD-10-CM | POA: Diagnosis not present

## 2023-02-16 DIAGNOSIS — E785 Hyperlipidemia, unspecified: Secondary | ICD-10-CM | POA: Diagnosis not present

## 2023-02-16 DIAGNOSIS — F411 Generalized anxiety disorder: Secondary | ICD-10-CM | POA: Diagnosis not present

## 2023-02-16 DIAGNOSIS — G20A1 Parkinson's disease without dyskinesia, without mention of fluctuations: Secondary | ICD-10-CM | POA: Diagnosis not present

## 2023-02-16 DIAGNOSIS — E854 Organ-limited amyloidosis: Secondary | ICD-10-CM | POA: Diagnosis not present

## 2023-02-16 DIAGNOSIS — F0283 Dementia in other diseases classified elsewhere, unspecified severity, with mood disturbance: Secondary | ICD-10-CM | POA: Diagnosis not present

## 2023-02-16 DIAGNOSIS — F0284 Dementia in other diseases classified elsewhere, unspecified severity, with anxiety: Secondary | ICD-10-CM | POA: Diagnosis not present

## 2023-02-20 ENCOUNTER — Telehealth: Payer: Self-pay | Admitting: Neurology

## 2023-02-20 NOTE — Telephone Encounter (Signed)
Mcarthur Rossetti Josem Kaufmann: [id number] exp. 02/20/23-03/22/23 sent to GI [phone]

## 2023-02-21 DIAGNOSIS — G20A1 Parkinson's disease without dyskinesia, without mention of fluctuations: Secondary | ICD-10-CM | POA: Diagnosis not present

## 2023-02-21 DIAGNOSIS — I959 Hypotension, unspecified: Secondary | ICD-10-CM | POA: Diagnosis not present

## 2023-02-21 DIAGNOSIS — I680 Cerebral amyloid angiopathy: Secondary | ICD-10-CM | POA: Diagnosis not present

## 2023-02-21 DIAGNOSIS — I69198 Other sequelae of nontraumatic intracerebral hemorrhage: Secondary | ICD-10-CM | POA: Diagnosis not present

## 2023-02-22 DIAGNOSIS — F0284 Dementia in other diseases classified elsewhere, unspecified severity, with anxiety: Secondary | ICD-10-CM | POA: Diagnosis not present

## 2023-02-22 DIAGNOSIS — F0283 Dementia in other diseases classified elsewhere, unspecified severity, with mood disturbance: Secondary | ICD-10-CM | POA: Diagnosis not present

## 2023-02-22 DIAGNOSIS — E854 Organ-limited amyloidosis: Secondary | ICD-10-CM | POA: Diagnosis not present

## 2023-02-22 DIAGNOSIS — E785 Hyperlipidemia, unspecified: Secondary | ICD-10-CM | POA: Diagnosis not present

## 2023-02-22 DIAGNOSIS — F329 Major depressive disorder, single episode, unspecified: Secondary | ICD-10-CM | POA: Diagnosis not present

## 2023-02-22 DIAGNOSIS — I951 Orthostatic hypotension: Secondary | ICD-10-CM | POA: Diagnosis not present

## 2023-02-22 DIAGNOSIS — I680 Cerebral amyloid angiopathy: Secondary | ICD-10-CM | POA: Diagnosis not present

## 2023-02-22 DIAGNOSIS — F411 Generalized anxiety disorder: Secondary | ICD-10-CM | POA: Diagnosis not present

## 2023-02-22 DIAGNOSIS — G20A1 Parkinson's disease without dyskinesia, without mention of fluctuations: Secondary | ICD-10-CM | POA: Diagnosis not present

## 2023-02-24 DIAGNOSIS — E854 Organ-limited amyloidosis: Secondary | ICD-10-CM | POA: Diagnosis not present

## 2023-02-24 DIAGNOSIS — F329 Major depressive disorder, single episode, unspecified: Secondary | ICD-10-CM | POA: Diagnosis not present

## 2023-02-24 DIAGNOSIS — I680 Cerebral amyloid angiopathy: Secondary | ICD-10-CM | POA: Diagnosis not present

## 2023-02-24 DIAGNOSIS — F0283 Dementia in other diseases classified elsewhere, unspecified severity, with mood disturbance: Secondary | ICD-10-CM | POA: Diagnosis not present

## 2023-02-24 DIAGNOSIS — I951 Orthostatic hypotension: Secondary | ICD-10-CM | POA: Diagnosis not present

## 2023-02-24 DIAGNOSIS — F0284 Dementia in other diseases classified elsewhere, unspecified severity, with anxiety: Secondary | ICD-10-CM | POA: Diagnosis not present

## 2023-02-24 DIAGNOSIS — G20A1 Parkinson's disease without dyskinesia, without mention of fluctuations: Secondary | ICD-10-CM | POA: Diagnosis not present

## 2023-02-24 DIAGNOSIS — E785 Hyperlipidemia, unspecified: Secondary | ICD-10-CM | POA: Diagnosis not present

## 2023-02-24 DIAGNOSIS — F411 Generalized anxiety disorder: Secondary | ICD-10-CM | POA: Diagnosis not present

## 2023-02-27 DIAGNOSIS — I951 Orthostatic hypotension: Secondary | ICD-10-CM | POA: Diagnosis not present

## 2023-02-27 DIAGNOSIS — F329 Major depressive disorder, single episode, unspecified: Secondary | ICD-10-CM | POA: Diagnosis not present

## 2023-02-27 DIAGNOSIS — F411 Generalized anxiety disorder: Secondary | ICD-10-CM | POA: Diagnosis not present

## 2023-02-27 DIAGNOSIS — I680 Cerebral amyloid angiopathy: Secondary | ICD-10-CM | POA: Diagnosis not present

## 2023-02-27 DIAGNOSIS — E854 Organ-limited amyloidosis: Secondary | ICD-10-CM | POA: Diagnosis not present

## 2023-02-27 DIAGNOSIS — E785 Hyperlipidemia, unspecified: Secondary | ICD-10-CM | POA: Diagnosis not present

## 2023-02-27 DIAGNOSIS — F0283 Dementia in other diseases classified elsewhere, unspecified severity, with mood disturbance: Secondary | ICD-10-CM | POA: Diagnosis not present

## 2023-02-27 DIAGNOSIS — F0284 Dementia in other diseases classified elsewhere, unspecified severity, with anxiety: Secondary | ICD-10-CM | POA: Diagnosis not present

## 2023-02-27 DIAGNOSIS — G20A1 Parkinson's disease without dyskinesia, without mention of fluctuations: Secondary | ICD-10-CM | POA: Diagnosis not present

## 2023-03-01 DIAGNOSIS — F0283 Dementia in other diseases classified elsewhere, unspecified severity, with mood disturbance: Secondary | ICD-10-CM | POA: Diagnosis not present

## 2023-03-01 DIAGNOSIS — E854 Organ-limited amyloidosis: Secondary | ICD-10-CM | POA: Diagnosis not present

## 2023-03-01 DIAGNOSIS — F329 Major depressive disorder, single episode, unspecified: Secondary | ICD-10-CM | POA: Diagnosis not present

## 2023-03-01 DIAGNOSIS — I680 Cerebral amyloid angiopathy: Secondary | ICD-10-CM | POA: Diagnosis not present

## 2023-03-01 DIAGNOSIS — F411 Generalized anxiety disorder: Secondary | ICD-10-CM | POA: Diagnosis not present

## 2023-03-01 DIAGNOSIS — E785 Hyperlipidemia, unspecified: Secondary | ICD-10-CM | POA: Diagnosis not present

## 2023-03-01 DIAGNOSIS — G20A1 Parkinson's disease without dyskinesia, without mention of fluctuations: Secondary | ICD-10-CM | POA: Diagnosis not present

## 2023-03-01 DIAGNOSIS — F0284 Dementia in other diseases classified elsewhere, unspecified severity, with anxiety: Secondary | ICD-10-CM | POA: Diagnosis not present

## 2023-03-01 DIAGNOSIS — I951 Orthostatic hypotension: Secondary | ICD-10-CM | POA: Diagnosis not present

## 2023-03-03 DIAGNOSIS — I680 Cerebral amyloid angiopathy: Secondary | ICD-10-CM | POA: Diagnosis not present

## 2023-03-03 DIAGNOSIS — F329 Major depressive disorder, single episode, unspecified: Secondary | ICD-10-CM | POA: Diagnosis not present

## 2023-03-03 DIAGNOSIS — G20A1 Parkinson's disease without dyskinesia, without mention of fluctuations: Secondary | ICD-10-CM | POA: Diagnosis not present

## 2023-03-03 DIAGNOSIS — E785 Hyperlipidemia, unspecified: Secondary | ICD-10-CM | POA: Diagnosis not present

## 2023-03-03 DIAGNOSIS — F0284 Dementia in other diseases classified elsewhere, unspecified severity, with anxiety: Secondary | ICD-10-CM | POA: Diagnosis not present

## 2023-03-03 DIAGNOSIS — I951 Orthostatic hypotension: Secondary | ICD-10-CM | POA: Diagnosis not present

## 2023-03-03 DIAGNOSIS — F0283 Dementia in other diseases classified elsewhere, unspecified severity, with mood disturbance: Secondary | ICD-10-CM | POA: Diagnosis not present

## 2023-03-03 DIAGNOSIS — F411 Generalized anxiety disorder: Secondary | ICD-10-CM | POA: Diagnosis not present

## 2023-03-03 DIAGNOSIS — E854 Organ-limited amyloidosis: Secondary | ICD-10-CM | POA: Diagnosis not present

## 2023-03-08 DIAGNOSIS — F0283 Dementia in other diseases classified elsewhere, unspecified severity, with mood disturbance: Secondary | ICD-10-CM | POA: Diagnosis not present

## 2023-03-08 DIAGNOSIS — F329 Major depressive disorder, single episode, unspecified: Secondary | ICD-10-CM | POA: Diagnosis not present

## 2023-03-08 DIAGNOSIS — E854 Organ-limited amyloidosis: Secondary | ICD-10-CM | POA: Diagnosis not present

## 2023-03-08 DIAGNOSIS — G20A1 Parkinson's disease without dyskinesia, without mention of fluctuations: Secondary | ICD-10-CM | POA: Diagnosis not present

## 2023-03-08 DIAGNOSIS — I951 Orthostatic hypotension: Secondary | ICD-10-CM | POA: Diagnosis not present

## 2023-03-08 DIAGNOSIS — E785 Hyperlipidemia, unspecified: Secondary | ICD-10-CM | POA: Diagnosis not present

## 2023-03-08 DIAGNOSIS — F411 Generalized anxiety disorder: Secondary | ICD-10-CM | POA: Diagnosis not present

## 2023-03-08 DIAGNOSIS — I680 Cerebral amyloid angiopathy: Secondary | ICD-10-CM | POA: Diagnosis not present

## 2023-03-08 DIAGNOSIS — F0284 Dementia in other diseases classified elsewhere, unspecified severity, with anxiety: Secondary | ICD-10-CM | POA: Diagnosis not present

## 2023-03-10 DIAGNOSIS — F0284 Dementia in other diseases classified elsewhere, unspecified severity, with anxiety: Secondary | ICD-10-CM | POA: Diagnosis not present

## 2023-03-10 DIAGNOSIS — F0283 Dementia in other diseases classified elsewhere, unspecified severity, with mood disturbance: Secondary | ICD-10-CM | POA: Diagnosis not present

## 2023-03-10 DIAGNOSIS — E785 Hyperlipidemia, unspecified: Secondary | ICD-10-CM | POA: Diagnosis not present

## 2023-03-10 DIAGNOSIS — F329 Major depressive disorder, single episode, unspecified: Secondary | ICD-10-CM | POA: Diagnosis not present

## 2023-03-10 DIAGNOSIS — F411 Generalized anxiety disorder: Secondary | ICD-10-CM | POA: Diagnosis not present

## 2023-03-10 DIAGNOSIS — I951 Orthostatic hypotension: Secondary | ICD-10-CM | POA: Diagnosis not present

## 2023-03-10 DIAGNOSIS — G20A1 Parkinson's disease without dyskinesia, without mention of fluctuations: Secondary | ICD-10-CM | POA: Diagnosis not present

## 2023-03-10 DIAGNOSIS — I680 Cerebral amyloid angiopathy: Secondary | ICD-10-CM | POA: Diagnosis not present

## 2023-03-10 DIAGNOSIS — E854 Organ-limited amyloidosis: Secondary | ICD-10-CM | POA: Diagnosis not present

## 2023-03-14 DIAGNOSIS — I951 Orthostatic hypotension: Secondary | ICD-10-CM | POA: Diagnosis not present

## 2023-03-14 DIAGNOSIS — F0284 Dementia in other diseases classified elsewhere, unspecified severity, with anxiety: Secondary | ICD-10-CM | POA: Diagnosis not present

## 2023-03-14 DIAGNOSIS — F411 Generalized anxiety disorder: Secondary | ICD-10-CM | POA: Diagnosis not present

## 2023-03-14 DIAGNOSIS — E785 Hyperlipidemia, unspecified: Secondary | ICD-10-CM | POA: Diagnosis not present

## 2023-03-14 DIAGNOSIS — I680 Cerebral amyloid angiopathy: Secondary | ICD-10-CM | POA: Diagnosis not present

## 2023-03-14 DIAGNOSIS — E854 Organ-limited amyloidosis: Secondary | ICD-10-CM | POA: Diagnosis not present

## 2023-03-14 DIAGNOSIS — G20A1 Parkinson's disease without dyskinesia, without mention of fluctuations: Secondary | ICD-10-CM | POA: Diagnosis not present

## 2023-03-14 DIAGNOSIS — R600 Localized edema: Secondary | ICD-10-CM | POA: Diagnosis not present

## 2023-03-14 DIAGNOSIS — F0283 Dementia in other diseases classified elsewhere, unspecified severity, with mood disturbance: Secondary | ICD-10-CM | POA: Diagnosis not present

## 2023-03-14 DIAGNOSIS — F02A0 Dementia in other diseases classified elsewhere, mild, without behavioral disturbance, psychotic disturbance, mood disturbance, and anxiety: Secondary | ICD-10-CM | POA: Diagnosis not present

## 2023-03-14 DIAGNOSIS — F329 Major depressive disorder, single episode, unspecified: Secondary | ICD-10-CM | POA: Diagnosis not present

## 2023-03-14 DIAGNOSIS — I10 Essential (primary) hypertension: Secondary | ICD-10-CM | POA: Diagnosis not present

## 2023-03-22 DIAGNOSIS — G20A1 Parkinson's disease without dyskinesia, without mention of fluctuations: Secondary | ICD-10-CM | POA: Diagnosis not present

## 2023-03-22 DIAGNOSIS — I69198 Other sequelae of nontraumatic intracerebral hemorrhage: Secondary | ICD-10-CM | POA: Diagnosis not present

## 2023-03-22 DIAGNOSIS — I959 Hypotension, unspecified: Secondary | ICD-10-CM | POA: Diagnosis not present

## 2023-03-22 DIAGNOSIS — I680 Cerebral amyloid angiopathy: Secondary | ICD-10-CM | POA: Diagnosis not present

## 2023-04-12 ENCOUNTER — Emergency Department (HOSPITAL_COMMUNITY)

## 2023-04-12 ENCOUNTER — Emergency Department (HOSPITAL_COMMUNITY)
Admission: EM | Admit: 2023-04-12 | Discharge: 2023-04-12 | Disposition: A | Discharge status: Home / Self Care | Attending: Emergency Medicine | Admitting: Emergency Medicine

## 2023-04-12 DIAGNOSIS — S0101XA Laceration without foreign body of scalp, initial encounter: Secondary | ICD-10-CM | POA: Diagnosis not present

## 2023-04-12 DIAGNOSIS — S0990XA Unspecified injury of head, initial encounter: Secondary | ICD-10-CM | POA: Diagnosis present

## 2023-04-12 DIAGNOSIS — Z23 Encounter for immunization: Secondary | ICD-10-CM | POA: Diagnosis not present

## 2023-04-12 DIAGNOSIS — S06360A Traumatic hemorrhage of cerebrum, unspecified, without loss of consciousness, initial encounter: Secondary | ICD-10-CM | POA: Diagnosis not present

## 2023-04-12 DIAGNOSIS — Y92129 Unspecified place in nursing home as the place of occurrence of the external cause: Secondary | ICD-10-CM | POA: Diagnosis not present

## 2023-04-12 DIAGNOSIS — W19XXXA Unspecified fall, initial encounter: Secondary | ICD-10-CM | POA: Insufficient documentation

## 2023-04-12 DIAGNOSIS — S0634AA Traumatic hemorrhage of right cerebrum with loss of consciousness status unknown, initial encounter: Secondary | ICD-10-CM

## 2023-04-12 DIAGNOSIS — M25652 Stiffness of left hip, not elsewhere classified: Secondary | ICD-10-CM | POA: Insufficient documentation

## 2023-04-12 LAB — CBC WITH DIFFERENTIAL/PLATELET
Abs Immature Granulocytes: 0.13 10*3/uL — ABNORMAL HIGH (ref 0.00–0.07)
Basophils Absolute: 0 10*3/uL (ref 0.0–0.1)
Basophils Relative: 0 %
Eosinophils Absolute: 0 10*3/uL (ref 0.0–0.5)
Eosinophils Relative: 0 %
HCT: 34.1 % — ABNORMAL LOW (ref 36.0–46.0)
Hemoglobin: 11.4 g/dL — ABNORMAL LOW (ref 12.0–15.0)
Immature Granulocytes: 1 %
Lymphocytes Relative: 10 %
Lymphs Abs: 1.1 10*3/uL (ref 0.7–4.0)
MCH: 29.5 pg (ref 26.0–34.0)
MCHC: 33.4 g/dL (ref 30.0–36.0)
MCV: 88.1 fL (ref 80.0–100.0)
Monocytes Absolute: 0.4 10*3/uL (ref 0.1–1.0)
Monocytes Relative: 4 %
Neutro Abs: 8.8 10*3/uL — ABNORMAL HIGH (ref 1.7–7.7)
Neutrophils Relative %: 85 %
Platelets: 223 10*3/uL (ref 150–400)
RBC: 3.87 MIL/uL (ref 3.87–5.11)
RDW: 13.8 % (ref 11.5–15.5)
WBC: 10.5 10*3/uL (ref 4.0–10.5)
nRBC: 0 % (ref 0.0–0.2)

## 2023-04-12 LAB — BASIC METABOLIC PANEL WITH GFR
Anion gap: 9 (ref 5–15)
BUN: 22 mg/dL (ref 8–23)
CO2: 24 mmol/L (ref 22–32)
Calcium: 8.9 mg/dL (ref 8.9–10.3)
Chloride: 107 mmol/L (ref 98–111)
Creatinine, Ser: 0.94 mg/dL (ref 0.44–1.00)
GFR, Estimated: >60 mL/min
Glucose, Bld: 128 mg/dL — ABNORMAL HIGH (ref 70–99)
Potassium: 3.7 mmol/L (ref 3.5–5.1)
Sodium: 140 mmol/L (ref 135–145)

## 2023-04-12 MED ORDER — LIDOCAINE-EPINEPHRINE (PF) 2 %-1:200000 IJ SOLN: 10.0000 mL | Freq: Once | INTRAMUSCULAR | Status: DC

## 2023-04-12 MED ORDER — TETANUS-DIPHTH-ACELL PERTUSSIS 5-2.5-18.5 LF-MCG/0.5 IM SUSY
0.5000 mL | PREFILLED_SYRINGE | Freq: Once | INTRAMUSCULAR | Status: AC
  Administered 2023-04-12: 0.5 mL via INTRAMUSCULAR
  Filled 2023-04-12: qty 0.5

## 2023-04-12 NOTE — ED Notes (Signed)
116/101 bp    [id number]

## 2023-04-12 NOTE — Discharge Planning (Signed)
Gaspar Bidding, [facility] liaison will visit pt and family today to discuss residential placement.

## 2023-04-12 NOTE — ED Notes (Signed)
Dr Franky Macho paged.

## 2023-04-12 NOTE — ED Notes (Signed)
Trauma Response Nurse Documentation   Desiree Allen is a 77 y.o. female arriving to [facility] ED via [facility]  On No antithrombotic. Trauma was activated as a Level 2 by Charge RN based on the following trauma criteria GCS 10-14 associated with trauma or AVPU < A. Trauma team at the bedside on patient arrival.   Patient cleared for CT by Dr. Adela Lank. Pt transported to CT with Primary nurse present to monitor. RN remained with the patient throughout their absence from the department for clinical observation.   GCS 12.  History   Past Medical History:  Diagnosis Date   Depression with anxiety    GERD (gastroesophageal reflux disease)    Hyperplastic colon polyp    Melanoma (HCC) 2012   Mixed dyslipidemia    Tremor    Venous insufficiency of both lower extremities    Vitamin D deficiency      Past Surgical History:  Procedure Laterality Date   BUNIONECTOMY Left    CATARACT EXTRACTION Bilateral    MELANOMA EXCISION  2012       Initial Focused Assessment (If applicable, or please see trauma documentation):  Airway- intact Breathing - shallow Circulation - bleeding noted to left side of head, controlled at present GCS - Eye - 3/verbal -3/motor - 5 CT's Completed:   CT Head and CT C-Spine   Interventions:   Labs Xrays CT Scans Family presence   Plan for disposition:  Discharge home   Consults completed:  Neurosurgeon - Cabbell -- see RN note  Event Summary:  See EDP and PRimary RN note    Bedside handoff with ED RN Chestine Spore.    Desiree Allen  Trauma Response RN  Please call TRN at [phone] for further assistance.

## 2023-04-12 NOTE — Progress Notes (Addendum)
Civil engineer, contracting [facility]) Hospital Liaison Note  This is a current [facility] hospice patient with a hospice diagnosis of cerebrovascular disease.   Patient has been accepted for transfer today to [facility]. Unit RN please call report to [phone] prior to the patient leaving the unit.   Please send paperwork and signed DNR with patient.  Please call with any questions or concerns. Thank you  Dionicio Stall, [facility] Liaison [phone]

## 2023-04-12 NOTE — Progress Notes (Signed)
Patient ID: Desiree Allen, female   DOB: 03-19-1946, 77 y.o.   MRN: [id number] BP 132/67   Pulse 90   Temp 98.1 F (36.7 C) (Axillary)   Resp (!) 23   Ht  (1.702 m)   Wt 65 kg   SpO2 100%   BMI 22.44 kg/m  Reviewed films and spoke with patients family. I believe this was a spontaneous hemorrhage, which led to the fall. Known history of amyloid, and trauma occurred on left side of head. There is no benefit from surgery, nor is it needed, nor do the family wish for any operative intervention. This is not a lethal injury, the basal cisterns are patent, suprasellar cisterns patent, and the quadrigeminal space patent. I concur with the family wishes for no intervention.

## 2023-04-12 NOTE — ED Provider Notes (Signed)
[place] EMERGENCY DEPARTMENT AT [facility] Provider Note   CSN: [id number] Arrival date & time: 04/12/23  4098     History  Chief Complaint  Patient presents with   Fall    Desiree Allen is a 77 y.o. female.  77 yo F with a chief complaint of a fall.  This was unwitnessed.  The patient lives in a memory care unit and she was witnessed to have been ambulating about 4 AM and then when she was rechecked at 6 was found on the ground.  Had obviously struck her head.  Had some skin tears to her arms bilaterally.  EMS were called and she was sent to the ED for evaluation.  Reportedly at baseline per EMS.   Fall       Home Medications Prior to Admission medications   Medication Sig Start Date End Date Taking? Authorizing Provider  acetaminophen (TYLENOL) 325 MG tablet Take 650 mg by mouth every 6 (six) hours as needed for mild pain or moderate pain.   Yes [provider]  ALPRAZolam Prudy Feeler) 0.5 MG tablet Every 12 hours as needed for agitation Patient taking differently: Take 0.5 mg by mouth every 12 (twelve) hours as needed for anxiety. 11/30/22  Yes Levert Feinstein, MD  carbidopa-levodopa (SINEMET IR) 25-100 MG tablet Take 1.5 tablets by mouth 3 (three) times daily. Patient taking differently: Take 1 tablet by mouth 3 (three) times daily. 09/08/22  Yes Glean Salvo, NP  hydrOXYzine (VISTARIL) 25 MG capsule Take 25 mg by mouth 2 (two) times daily. 02/28/23  Yes [provider]  levETIRAcetam (KEPPRA) 500 MG tablet Take 500 mg by mouth 2 (two) times daily.   Yes [provider]  QUEtiapine (SEROQUEL) 50 MG tablet Take 50 mg by mouth 2 (two) times daily.   Yes [provider]  sertraline (ZOLOFT) 100 MG tablet Take 2 tablets (200 mg total) by mouth daily. 08/02/22  Yes White, Arlys John A, NP  torsemide (DEMADEX) 10 MG tablet Take 10 mg by mouth daily. 02/24/23  Yes [provider]  traZODone (DESYREL) 100 MG tablet Take 100 mg by mouth at  bedtime. 02/24/23  Yes [provider]  melatonin 1 MG TABS tablet Take 1 tablet (1 mg total) by mouth at bedtime. Patient not taking: Reported on 04/12/2023 11/30/22   Levert Feinstein, MD  potassium chloride SA (KLOR-CON M) 20 MEQ tablet Take 1 tablet (20 mEq total) by mouth 2 (two) times daily for 3 days. Patient not taking: Reported on 04/12/2023 02/10/23 02/13/23  Sabas Sous, MD      Allergies    Aspirin, Warfarin and related, and Penicillins    Review of Systems   Review of Systems  Physical Exam Updated Vital Signs BP 117/79   Pulse 79   Temp 98.6 F (37 C)   Resp 16   Ht 5\' 7"  (1.702 m)   Wt 65 kg   SpO2 98%   BMI 22.44 kg/m  Physical Exam Vitals and nursing note reviewed.  Constitutional:      General: She is not in acute distress.    Appearance: She is well-developed. She is not diaphoretic.  HENT:     Head: Normocephalic.     Comments: Left temporal hematoma small laceration to the left temporal area. Eyes:     Pupils: Pupils are equal, round, and reactive to light.  Cardiovascular:     Rate and Rhythm: Normal rate and regular rhythm.  Heart sounds: No murmur heard.    No friction rub. No gallop.  Pulmonary:     Effort: Pulmonary effort is normal.     Breath sounds: No wheezing or rales.  Abdominal:     General: There is no distension.     Palpations: Abdomen is soft.     Tenderness: There is no abdominal tenderness.  Musculoskeletal:        General: No tenderness.     Cervical back: Normal range of motion and neck supple.     Comments: Palpated from head to toe without any other noted areas of bony tenderness.  She has some mild stiffness to the left hip.  Skin:    General: Skin is warm and dry.  Neurological:     Mental Status: She is alert and oriented to person, place, and time.     Comments: Patient will answer intermittently.  Localizes to pain.  Groans.  Holds eyes forcefully shut.  Pupils equal round reactive to light.  Psychiatric:         Behavior: Behavior normal.     ED Results / Procedures / Treatments   Labs (all labs ordered are listed, but only abnormal results are displayed) Labs Reviewed  CBC WITH DIFFERENTIAL/PLATELET - Abnormal; Notable for the following components:      Result Value   Hemoglobin 11.4 (*)    HCT 34.1 (*)    Neutro Abs 8.8 (*)    Abs Immature Granulocytes 0.13 (*)    All other components within normal limits  BASIC METABOLIC PANEL - Abnormal; Notable for the following components:   Glucose, Bld 128 (*)    All other components within normal limits    EKG None  Radiology DG Pelvis Portable  Result Date: 04/12/2023 CLINICAL DATA:  fall EXAM: PORTABLE PELVIS 1-2 VIEWS COMPARISON:  CT 02/06/2021 FINDINGS: There is no evidence of pelvic fracture or diastasis. No pelvic bone lesions are seen. Degenerative changes in the lower lumbar spine with levoscoliosis. Aortic Atherosclerosis (ICD10-170.0). IMPRESSION: Negative. Electronically Signed   By: Corlis Leak M.D.   On: 04/12/2023 11:15   DG Chest Port 1 View  Result Date: 04/12/2023 CLINICAL DATA:  Larey Seat EXAM: PORTABLE CHEST - 1 VIEW COMPARISON:  02/09/2023 FINDINGS: Lungs are clear. Heart size and mediastinal contours are within normal limits. No effusion.  No pneumothorax. Vertebral endplate spurring at multiple levels in the mid and lower thoracic spine. IMPRESSION: No acute cardiopulmonary disease. Electronically Signed   By: Corlis Leak M.D.   On: 04/12/2023 11:14   CT Head Wo Contrast  Result Date: 04/12/2023 CLINICAL DATA:  Facial trauma EXAM: CT HEAD WITHOUT CONTRAST CT MAXILLOFACIAL WITHOUT CONTRAST CT CERVICAL SPINE WITHOUT CONTRAST TECHNIQUE: Multidetector CT imaging of the head, cervical spine, and maxillofacial structures were performed using the standard protocol without intravenous contrast. Multiplanar CT image reconstructions of the cervical spine and maxillofacial structures were also generated. RADIATION DOSE REDUCTION: This exam was  performed according to the departmental dose-optimization program which includes automated exposure control, adjustment of the mA and/or kV according to patient size and/or use of iterative reconstruction technique. COMPARISON:  CT head and cervical spine 02/10/2023 FINDINGS: CT HEAD FINDINGS Brain: There is a large intraparenchymal hematoma centered in the right frontal lobe measuring up to 7.0 cm AP x 2.6 cm TV x 3.9 cm cc (estimated volume 35 cc). There is surrounding edema with regional sulcal effacement, partial effacement of the right lateral ventricle, and 8 mm leftward midline shift. There is  trace subarachnoid hemorrhage overlying the right temporal lobe (3-15). There is no evidence of acute territorial infarct. Encephalomalacia in the left superior frontal gyrus is unchanged. Vascular: No hyperdense vessel or unexpected calcification. Skull: Normal. Negative for fracture or focal lesion. Other: There is a left frontal scalp hematoma. CT MAXILLOFACIAL FINDINGS Osseous: There is no acute facial bone fracture. There is no evidence of mandibular dislocation. There is no suspicious osseous lesion. Orbits: Bilateral lens implants are in place. There is no retrobulbar hematoma. Sinuses: The paranasal sinuses, mastoid air cells, and middle ear cavities are clear. Soft tissues: There is left periorbital soft tissue swelling. CT CERVICAL SPINE FINDINGS Alignment: Normal. There is no jumped or perched facet or other evidence of traumatic malalignment. Skull base and vertebrae: Skull base alignment is maintained. Vertebral body heights are preserved. There is no evidence of acute fracture. There is no suspicious osseous lesion. Soft tissues and spinal canal: No prevertebral fluid or swelling. No visible canal hematoma. Disc levels: There is overall mild disc space narrowing and degenerative endplate change, most advanced at C3-C4 through C5-C6. There are associated disc protrusions resulting in at least  mild-to-moderate spinal canal stenosis at C3-C4. Upper chest: There is right worse than left apical scarring. Other: None. IMPRESSION: 1. Large intraparenchymal hematoma centered in the right frontal lobe measuring with an estimated volume of 35 cc with surrounding edema and mass effect resulting in 8 mm leftward midline shift. 2. Trace subarachnoid hemorrhage overlying the right temporal lobe. 3. Left frontal scalp hematoma and periorbital soft tissue swelling without underlying calvarial or facial bone fracture. 4. No acute fracture or traumatic malalignment of the cervical spine. Critical Value/emergent results were called by telephone at the time of interpretation on 04/12/2023 at 8:07 am to provider Vicy Medico , who verbally acknowledged these results. Electronically Signed   By: Lesia Hausen M.D.   On: 04/12/2023 08:11   CT Cervical Spine Wo Contrast  Result Date: 04/12/2023 CLINICAL DATA:  Facial trauma EXAM: CT HEAD WITHOUT CONTRAST CT MAXILLOFACIAL WITHOUT CONTRAST CT CERVICAL SPINE WITHOUT CONTRAST TECHNIQUE: Multidetector CT imaging of the head, cervical spine, and maxillofacial structures were performed using the standard protocol without intravenous contrast. Multiplanar CT image reconstructions of the cervical spine and maxillofacial structures were also generated. RADIATION DOSE REDUCTION: This exam was performed according to the departmental dose-optimization program which includes automated exposure control, adjustment of the mA and/or kV according to patient size and/or use of iterative reconstruction technique. COMPARISON:  CT head and cervical spine 02/10/2023 FINDINGS: CT HEAD FINDINGS Brain: There is a large intraparenchymal hematoma centered in the right frontal lobe measuring up to 7.0 cm AP x 2.6 cm TV x 3.9 cm cc (estimated volume 35 cc). There is surrounding edema with regional sulcal effacement, partial effacement of the right lateral ventricle, and 8 mm leftward midline shift. There is  trace subarachnoid hemorrhage overlying the right temporal lobe (3-15). There is no evidence of acute territorial infarct. Encephalomalacia in the left superior frontal gyrus is unchanged. Vascular: No hyperdense vessel or unexpected calcification. Skull: Normal. Negative for fracture or focal lesion. Other: There is a left frontal scalp hematoma. CT MAXILLOFACIAL FINDINGS Osseous: There is no acute facial bone fracture. There is no evidence of mandibular dislocation. There is no suspicious osseous lesion. Orbits: Bilateral lens implants are in place. There is no retrobulbar hematoma. Sinuses: The paranasal sinuses, mastoid air cells, and middle ear cavities are clear. Soft tissues: There is left periorbital soft tissue swelling. CT CERVICAL SPINE  FINDINGS Alignment: Normal. There is no jumped or perched facet or other evidence of traumatic malalignment. Skull base and vertebrae: Skull base alignment is maintained. Vertebral body heights are preserved. There is no evidence of acute fracture. There is no suspicious osseous lesion. Soft tissues and spinal canal: No prevertebral fluid or swelling. No visible canal hematoma. Disc levels: There is overall mild disc space narrowing and degenerative endplate change, most advanced at C3-C4 through C5-C6. There are associated disc protrusions resulting in at least mild-to-moderate spinal canal stenosis at C3-C4. Upper chest: There is right worse than left apical scarring. Other: None. IMPRESSION: 1. Large intraparenchymal hematoma centered in the right frontal lobe measuring with an estimated volume of 35 cc with surrounding edema and mass effect resulting in 8 mm leftward midline shift. 2. Trace subarachnoid hemorrhage overlying the right temporal lobe. 3. Left frontal scalp hematoma and periorbital soft tissue swelling without underlying calvarial or facial bone fracture. 4. No acute fracture or traumatic malalignment of the cervical spine. Critical Value/emergent results  were called by telephone at the time of interpretation on 04/12/2023 at 8:07 am to provider Masiyah Engen , who verbally acknowledged these results. Electronically Signed   By: Lesia Hausen M.D.   On: 04/12/2023 08:11   CT Maxillofacial Wo Contrast  Result Date: 04/12/2023 CLINICAL DATA:  Facial trauma EXAM: CT HEAD WITHOUT CONTRAST CT MAXILLOFACIAL WITHOUT CONTRAST CT CERVICAL SPINE WITHOUT CONTRAST TECHNIQUE: Multidetector CT imaging of the head, cervical spine, and maxillofacial structures were performed using the standard protocol without intravenous contrast. Multiplanar CT image reconstructions of the cervical spine and maxillofacial structures were also generated. RADIATION DOSE REDUCTION: This exam was performed according to the departmental dose-optimization program which includes automated exposure control, adjustment of the mA and/or kV according to patient size and/or use of iterative reconstruction technique. COMPARISON:  CT head and cervical spine 02/10/2023 FINDINGS: CT HEAD FINDINGS Brain: There is a large intraparenchymal hematoma centered in the right frontal lobe measuring up to 7.0 cm AP x 2.6 cm TV x 3.9 cm cc (estimated volume 35 cc). There is surrounding edema with regional sulcal effacement, partial effacement of the right lateral ventricle, and 8 mm leftward midline shift. There is trace subarachnoid hemorrhage overlying the right temporal lobe (3-15). There is no evidence of acute territorial infarct. Encephalomalacia in the left superior frontal gyrus is unchanged. Vascular: No hyperdense vessel or unexpected calcification. Skull: Normal. Negative for fracture or focal lesion. Other: There is a left frontal scalp hematoma. CT MAXILLOFACIAL FINDINGS Osseous: There is no acute facial bone fracture. There is no evidence of mandibular dislocation. There is no suspicious osseous lesion. Orbits: Bilateral lens implants are in place. There is no retrobulbar hematoma. Sinuses: The paranasal  sinuses, mastoid air cells, and middle ear cavities are clear. Soft tissues: There is left periorbital soft tissue swelling. CT CERVICAL SPINE FINDINGS Alignment: Normal. There is no jumped or perched facet or other evidence of traumatic malalignment. Skull base and vertebrae: Skull base alignment is maintained. Vertebral body heights are preserved. There is no evidence of acute fracture. There is no suspicious osseous lesion. Soft tissues and spinal canal: No prevertebral fluid or swelling. No visible canal hematoma. Disc levels: There is overall mild disc space narrowing and degenerative endplate change, most advanced at C3-C4 through C5-C6. There are associated disc protrusions resulting in at least mild-to-moderate spinal canal stenosis at C3-C4. Upper chest: There is right worse than left apical scarring. Other: None. IMPRESSION: 1. Large intraparenchymal hematoma centered in the  right frontal lobe measuring with an estimated volume of 35 cc with surrounding edema and mass effect resulting in 8 mm leftward midline shift. 2. Trace subarachnoid hemorrhage overlying the right temporal lobe. 3. Left frontal scalp hematoma and periorbital soft tissue swelling without underlying calvarial or facial bone fracture. 4. No acute fracture or traumatic malalignment of the cervical spine. Critical Value/emergent results were called by telephone at the time of interpretation on 04/12/2023 at 8:07 am to provider Barbette Mcglaun , who verbally acknowledged these results. Electronically Signed   By: Lesia Hausen M.D.   On: 04/12/2023 08:11    Procedures .Critical Care  Performed by: Melene Plan, DO Authorized by: Melene Plan, DO   Critical care provider statement:    Critical care time (minutes):  35   Critical care time was exclusive of:  Separately billable procedures and treating other patients   Critical care was time spent personally by me on the following activities:  Development of treatment plan with patient or  surrogate, discussions with consultants, evaluation of patient's response to treatment, examination of patient, ordering and review of laboratory studies, ordering and review of radiographic studies, ordering and performing treatments and interventions, pulse oximetry, re-evaluation of patient's condition and review of old charts   Care discussed with: admitting provider       Medications Ordered in ED Medications  lidocaine-EPINEPHrine (XYLOCAINE W/EPI) 2 %-1:200000 (PF) injection 10 mL (has no administration in time range)  Tdap (BOOSTRIX) injection 0.5 mL (0.5 mLs Intramuscular Given 04/12/23 0741)    ED Course/ Medical Decision Making/ A&P                             Medical Decision Making Amount and/or Complexity of Data Reviewed Labs: ordered. Radiology: ordered.  Risk Prescription drug management.   77 yo F with a cc of fall.  Patient severely demented.  Reportedly at baseline.  Will obtain CT imaging of the head face and C-spine.  Plain film of the chest and pelvis.  Update tetanus.  Patient's family has arrived and provides further history.  Tell me that she has had 4 falls in the past couple weeks.  She fell about a week ago and they noticed that she had perhaps some facial droop and was evaluated by the authora care and felt did not require transport to the hospital.  CT of the head is concerning for an intraparenchymal hemorrhage with shift on my independent interpretation.  I discussed this with the family and showed them the imaging. Not sure at this point if they would want intervention.  Discussed with Dr. Franky Macho, neurosurgery.  Will eval.  Had discussion with family.  Felt this was unlikely to be terminal.  Family not wanting intervention.  Family concerned about current living situation.  Im not sure that there is any more intensive facility available.  She is already on hospice, some inquiry about residential hospice. Will consult social work.   The patient was  placed into residential hospice.  The patients results and plan were reviewed and discussed.   Any x-rays performed were independently reviewed by myself.   Differential diagnosis were considered with the presenting HPI.  Medications  lidocaine-EPINEPHrine (XYLOCAINE W/EPI) 2 %-1:200000 (PF) injection 10 mL (has no administration in time range)  Tdap (BOOSTRIX) injection 0.5 mL (0.5 mLs Intramuscular Given 04/12/23 0741)    Vitals:   04/12/23 1130 04/12/23 1200 04/12/23 1230 04/12/23 1300  BP: 138/78 124/79  137/61 117/79  Pulse: 82 80 80 79  Resp: Temp:      TempSrc:      SpO2: 100% 99% 98% 98%  Weight:      Height:        Final diagnoses:  Traumatic right-sided intracerebral hemorrhage with unknown loss of consciousness status, initial encounter           Final Clinical Impression(s) / ED Diagnoses Final diagnoses:  Traumatic right-sided intracerebral hemorrhage with unknown loss of consciousness status, initial encounter    Rx / DC Orders ED Discharge Orders     None         Melene Plan, DO 04/12/23 1327

## 2023-04-12 NOTE — Discharge Planning (Signed)
RNCM consulted regarding residential hospice placement.  RNCM reached out to hospital liaison [phone]) for intake availability. Will update team with results.

## 2023-04-12 NOTE — Progress Notes (Signed)
Orthopedic Tech Progress Note Patient Details:  Desiree Allen [birth year] [id number]  Level 2 trauma   Patient ID: Desiree Allen, female   DOB: [birth year], 77 y.o.   MRN: [id number]  Desiree Allen 04/12/2023, 9:12 AM

## 2023-04-18 ENCOUNTER — Ambulatory Visit: Payer: Medicare PPO | Admitting: Neurology

## 2023-04-26 DEATH — deceased
